# Patient Record
Sex: Female | Born: 1990 | Race: Asian | Hispanic: No | Marital: Married | Smoking: Never smoker
Health system: Southern US, Community
[De-identification: ages and names within clinical notes are randomized; demographics above are authoritative.]

## PROBLEM LIST (undated history)

## (undated) DIAGNOSIS — Z603 Acculturation difficulty: Secondary | ICD-10-CM

## (undated) DIAGNOSIS — R4589 Other symptoms and signs involving emotional state: Secondary | ICD-10-CM

## (undated) DIAGNOSIS — R509 Fever, unspecified: Secondary | ICD-10-CM

## (undated) DIAGNOSIS — R109 Unspecified abdominal pain: Secondary | ICD-10-CM

## (undated) DIAGNOSIS — R059 Cough, unspecified: Secondary | ICD-10-CM

## (undated) DIAGNOSIS — I4510 Unspecified right bundle-branch block: Secondary | ICD-10-CM

## (undated) DIAGNOSIS — R0602 Shortness of breath: Secondary | ICD-10-CM

## (undated) DIAGNOSIS — Z2839 Supervision of other high risk pregnancies, unspecified trimester: Secondary | ICD-10-CM

## (undated) DIAGNOSIS — R0789 Other chest pain: Secondary | ICD-10-CM

## (undated) DIAGNOSIS — R768 Other specified abnormal immunological findings in serum: Secondary | ICD-10-CM

## (undated) DIAGNOSIS — Z283 Underimmunization status: Secondary | ICD-10-CM

## (undated) DIAGNOSIS — R5381 Other malaise: Secondary | ICD-10-CM

## (undated) DIAGNOSIS — R05 Cough: Secondary | ICD-10-CM

## (undated) DIAGNOSIS — R52 Pain, unspecified: Secondary | ICD-10-CM

## (undated) DIAGNOSIS — K5900 Constipation, unspecified: Secondary | ICD-10-CM

## (undated) DIAGNOSIS — R5383 Other fatigue: Secondary | ICD-10-CM

## (undated) DIAGNOSIS — J321 Chronic frontal sinusitis: Secondary | ICD-10-CM

## (undated) DIAGNOSIS — K219 Gastro-esophageal reflux disease without esophagitis: Secondary | ICD-10-CM

## (undated) DIAGNOSIS — Z8759 Personal history of other complications of pregnancy, childbirth and the puerperium: Secondary | ICD-10-CM

## (undated) DIAGNOSIS — O09899 Supervision of other high risk pregnancies, unspecified trimester: Secondary | ICD-10-CM

## (undated) HISTORY — DX: Supervision of other high risk pregnancies, unspecified trimester: Z28.39

## (undated) HISTORY — DX: Cough, unspecified: R05.9

## (undated) HISTORY — DX: Chronic frontal sinusitis: J32.1

## (undated) HISTORY — DX: Acculturation difficulty: Z60.3

## (undated) HISTORY — DX: Other chest pain: R07.89

## (undated) HISTORY — DX: Pain, unspecified: R52

## (undated) HISTORY — DX: Constipation, unspecified: K59.00

## (undated) HISTORY — DX: Underimmunization status: Z28.3

## (undated) HISTORY — DX: Other fatigue: R53.83

## (undated) HISTORY — DX: Other symptoms and signs involving emotional state: R45.89

## (undated) HISTORY — DX: Fever, unspecified: R50.9

## (undated) HISTORY — DX: Personal history of other complications of pregnancy, childbirth and the puerperium: Z87.59

## (undated) HISTORY — DX: Other malaise: R53.81

## (undated) HISTORY — DX: Supervision of other high risk pregnancies, unspecified trimester: O09.899

## (undated) HISTORY — DX: Other specified abnormal immunological findings in serum: R76.8

## (undated) HISTORY — DX: Cough: R05

## (undated) HISTORY — DX: Unspecified right bundle-branch block: I45.10

## (undated) HISTORY — DX: Gastro-esophageal reflux disease without esophagitis: K21.9

## (undated) HISTORY — DX: Shortness of breath: R06.02

## (undated) HISTORY — DX: Unspecified abdominal pain: R10.9

---

## 2015-07-11 DIAGNOSIS — Z8759 Personal history of other complications of pregnancy, childbirth and the puerperium: Secondary | ICD-10-CM | POA: Insufficient documentation

## 2015-12-04 DIAGNOSIS — R768 Other specified abnormal immunological findings in serum: Secondary | ICD-10-CM | POA: Insufficient documentation

## 2015-12-30 ENCOUNTER — Encounter (HOSPITAL_COMMUNITY): Payer: Self-pay | Admitting: *Deleted

## 2015-12-30 ENCOUNTER — Emergency Department (HOSPITAL_COMMUNITY)
Admission: EM | Admit: 2015-12-30 | Discharge: 2015-12-30 | Disposition: A | Discharge status: Home / Self Care | Payer: Medicaid Other | Attending: Emergency Medicine | Admitting: Emergency Medicine

## 2015-12-30 DIAGNOSIS — Z3202 Encounter for pregnancy test, result negative: Secondary | ICD-10-CM | POA: Diagnosis not present

## 2015-12-30 DIAGNOSIS — R1013 Epigastric pain: Secondary | ICD-10-CM | POA: Diagnosis not present

## 2015-12-30 DIAGNOSIS — R109 Unspecified abdominal pain: Secondary | ICD-10-CM | POA: Diagnosis present

## 2015-12-30 LAB — URINALYSIS, ROUTINE W REFLEX MICROSCOPIC
Bilirubin Urine: NEGATIVE
GLUCOSE, UA: NEGATIVE mg/dL
Ketones, ur: NEGATIVE mg/dL
LEUKOCYTES UA: NEGATIVE
Nitrite: NEGATIVE
Protein, ur: NEGATIVE mg/dL
SPECIFIC GRAVITY, URINE: 1.011 (ref 1.005–1.030)
pH: 6.5 (ref 5.0–8.0)

## 2015-12-30 LAB — COMPREHENSIVE METABOLIC PANEL
ALK PHOS: 68 U/L (ref 38–126)
ALT: 15 U/L (ref 14–54)
AST: 21 U/L (ref 15–41)
Albumin: 4.8 g/dL (ref 3.5–5.0)
Anion gap: 11 (ref 5–15)
BILIRUBIN TOTAL: 0.4 mg/dL (ref 0.3–1.2)
BUN: 10 mg/dL (ref 6–20)
CALCIUM: 9.6 mg/dL (ref 8.9–10.3)
CO2: 25 mmol/L (ref 22–32)
CREATININE: 0.63 mg/dL (ref 0.44–1.00)
Chloride: 104 mmol/L (ref 101–111)
Glucose, Bld: 81 mg/dL (ref 65–99)
Potassium: 4.1 mmol/L (ref 3.5–5.1)
Sodium: 140 mmol/L (ref 135–145)
TOTAL PROTEIN: 8.4 g/dL — AB (ref 6.5–8.1)

## 2015-12-30 LAB — CBC WITH DIFFERENTIAL/PLATELET
Basophils Absolute: 0.1 10*3/uL (ref 0.0–0.1)
Basophils Relative: 1 %
Eosinophils Absolute: 0.4 10*3/uL (ref 0.0–0.7)
Eosinophils Relative: 7 %
HCT: 39.3 % (ref 36.0–46.0)
HEMOGLOBIN: 13.1 g/dL (ref 12.0–15.0)
LYMPHS ABS: 2.1 10*3/uL (ref 0.7–4.0)
LYMPHS PCT: 41 %
MCH: 29.8 pg (ref 26.0–34.0)
MCHC: 33.3 g/dL (ref 30.0–36.0)
MCV: 89.3 fL (ref 78.0–100.0)
MONOS PCT: 8 %
Monocytes Absolute: 0.4 10*3/uL (ref 0.1–1.0)
NEUTROS PCT: 43 %
Neutro Abs: 2.2 10*3/uL (ref 1.7–7.7)
Platelets: 271 10*3/uL (ref 150–400)
RBC: 4.4 MIL/uL (ref 3.87–5.11)
RDW: 12 % (ref 11.5–15.5)
WBC: 5.1 10*3/uL (ref 4.0–10.5)

## 2015-12-30 LAB — URINE MICROSCOPIC-ADD ON
Squamous Epithelial / LPF: NONE SEEN
WBC, UA: NONE SEEN WBC/hpf (ref 0–5)

## 2015-12-30 LAB — RAPID URINE DRUG SCREEN, HOSP PERFORMED
AMPHETAMINES: NOT DETECTED
BENZODIAZEPINES: NOT DETECTED
Barbiturates: NOT DETECTED
Cocaine: NOT DETECTED
OPIATES: NOT DETECTED
TETRAHYDROCANNABINOL: NOT DETECTED

## 2015-12-30 LAB — I-STAT BETA HCG BLOOD, ED (MC, WL, AP ONLY)

## 2015-12-30 LAB — LIPASE, BLOOD: Lipase: 28 U/L (ref 11–51)

## 2015-12-30 MED ORDER — SUCRALFATE 1 GM/10ML PO SUSP: 1.0000 g | Freq: Three times a day (TID) | ORAL | Status: DC

## 2015-12-30 MED ORDER — PANTOPRAZOLE SODIUM 20 MG PO TBEC: 20.0000 mg | DELAYED_RELEASE_TABLET | Freq: Every day | ORAL | Status: DC

## 2015-12-30 NOTE — ED Provider Notes (Signed)
CSN: [id number]     Arrival date & time 12/30/15  1228 History   First MD Initiated Contact with Patient 12/30/15 1254     Chief Complaint  Patient presents with  . Abdominal Pain     (Consider location/radiation/quality/duration/timing/severity/associated sxs/prior Treatment) HPI  Translator and family member were present for history. Patient has been having problems with abdominal pain for at least several months. There was some difficulty narrowing down location and duration. Initially there seemed to be predominantly lower abdominal pain. However after using the translator and readdressing this family member, it appears that this is epigastric pain. She has had it for several months. It is burning in quality with some cramping quality as well. It is apparently not made worse or better by eating. Patient had taken a course of Prilosec without improvement. History reviewed. No pertinent past medical history. History reviewed. No pertinent past surgical history. No family history on file. Social History  Substance Use Topics  . Smoking status: Never Smoker   . Smokeless tobacco: Never Used  . Alcohol Use: No   OB History    No data available     Review of Systems  10 Systems reviewed and are negative for acute change except as noted in the HPI.   Allergies  Review of patient's allergies indicates no known allergies.  Home Medications   Prior to Admission medications   Medication Sig Start Date End Date Taking? Authorizing Provider  pantoprazole (PROTONIX) 20 MG tablet Take 1 tablet (20 mg total) by mouth daily. 12/30/15   Arby Barrette, MD  sucralfate (CARAFATE) 1 GM/10ML suspension Take 10 mLs (1 g total) by mouth 4 (four) times daily -  with meals and at bedtime. 12/30/15   Arby Barrette, MD   BP 105/74 mmHg  Pulse 85  Temp(Src) 98.6 F (37 C) (Oral)  Resp 20  SpO2 100%  LMP 12/27/2015 Physical Exam  Constitutional: She is oriented to person, place, and time. She  appears well-developed and well-nourished.  HENT:  Head: Normocephalic and atraumatic.  Eyes: EOM are normal. Pupils are equal, round, and reactive to light.  Neck: Neck supple.  Cardiovascular: Normal rate, regular rhythm, normal heart sounds and intact distal pulses.   Pulmonary/Chest: Effort normal and breath sounds normal.  Abdominal: Soft. Bowel sounds are normal. She exhibits no distension. There is no tenderness.  Genitourinary:  Normal external female genitalia. No lesions. Speculum examination small amount of vaginal bleeding that appears consistent with menstrual bleeding. No clots. Cervix is normal in appearance. With palpation patient endorsed some degree of right adnexal tenderness. No mass or fullness present. No cervical motion tenderness.  Musculoskeletal: Normal range of motion. She exhibits no edema.  Neurological: She is alert and oriented to person, place, and time. She has normal strength. Coordination normal. GCS eye subscore is 4. GCS verbal subscore is 5. GCS motor subscore is 6.  Skin: Skin is warm, dry and intact.  Psychiatric: She has a normal mood and affect.    ED Course  Procedures (including critical care time) Labs Review Labs Reviewed  URINALYSIS, ROUTINE W REFLEX MICROSCOPIC (NOT AT [facility]) - Abnormal; Notable for the following:    Hgb urine dipstick MODERATE (*)    All other components within normal limits  COMPREHENSIVE METABOLIC PANEL - Abnormal; Notable for the following:    Total Protein 8.4 (*)    All other components within normal limits  URINE MICROSCOPIC-ADD ON - Abnormal; Notable for the following:    Bacteria, UA  RARE (*)    All other components within normal limits  URINE RAPID DRUG SCREEN, HOSP PERFORMED  CBC WITH DIFFERENTIAL/PLATELET  LIPASE, BLOOD  RPR  HIV ANTIBODY (ROUTINE TESTING)  I-STAT BETA HCG BLOOD, ED ([facility], [facility], [facility] ONLY)  GC/CHLAMYDIA PROBE AMP ([place]) NOT AT [facility]    Imaging Review No results found. I have personally  reviewed and evaluated these images and lab results as part of my medical decision-making.   EKG Interpretation None      MDM   Final diagnoses:  Epigastric pain   Patient is well in appearance. Physical examination is essentially normal. Abdominal examination did not elicit pain response. After clarifications, it appears that the patient's predominant concern is epigastric pain that is burning in nature. She does have empty prescription bottles that were for clarithromycin and amoxicillin as well as Prilosec. I do believe that gastritis is the most likely etiology. A she'll be given protonix and Carafate which she is instructed to take routinely for 2 weeks duration. She is also given instructions regarding dietary intake. Counseling was given regarding obtaining a family physician to coordinate care and possible referral to gastroenterology.    Arby Barrette, MD 12/30/15 [phone]

## 2015-12-30 NOTE — ED Notes (Addendum)
Patient c/o epigastric and low abdominal pain since August 2016.  Patient also c/o nausea.  Patient denies vomiting and diarrhea.  Patient also c/o urinary frequency and white vaginal discharge.  Patient states she has had vaginal discharge 2-3 months.  Patient also endorses fever and anxiety.  Patient's friend translated in triage, use of medical interpreter recommended.  Patient is [place].

## 2015-12-30 NOTE — Discharge Instructions (Signed)

## 2015-12-30 NOTE — ED Notes (Signed)
Person in room to transport pt home speaks english, instructions reviewed with him also.

## 2015-12-31 LAB — GC/CHLAMYDIA PROBE AMP (~~LOC~~) NOT AT ARMC
Chlamydia: NEGATIVE
Neisseria Gonorrhea: NEGATIVE

## 2016-01-01 LAB — RPR: RPR: NONREACTIVE

## 2016-01-01 LAB — HIV ANTIBODY (ROUTINE TESTING W REFLEX): HIV SCREEN 4TH GENERATION: NONREACTIVE

## 2016-10-13 DIAGNOSIS — Z2839 Other underimmunization status: Secondary | ICD-10-CM | POA: Insufficient documentation

## 2016-11-06 DIAGNOSIS — Z3492 Encounter for supervision of normal pregnancy, unspecified, second trimester: Secondary | ICD-10-CM | POA: Insufficient documentation

## 2016-12-04 DIAGNOSIS — Z789 Other specified health status: Secondary | ICD-10-CM | POA: Insufficient documentation

## 2016-12-07 ENCOUNTER — Emergency Department (HOSPITAL_COMMUNITY)
Admission: EM | Admit: 2016-12-07 | Discharge: 2016-12-07 | Disposition: A | Discharge status: Home / Self Care | Payer: Medicaid Other | Attending: Emergency Medicine | Admitting: Emergency Medicine

## 2016-12-07 ENCOUNTER — Emergency Department (HOSPITAL_COMMUNITY): Payer: Medicaid Other

## 2016-12-07 ENCOUNTER — Encounter (HOSPITAL_COMMUNITY): Payer: Self-pay

## 2016-12-07 DIAGNOSIS — R55 Syncope and collapse: Secondary | ICD-10-CM | POA: Insufficient documentation

## 2016-12-07 DIAGNOSIS — Z3A18 18 weeks gestation of pregnancy: Secondary | ICD-10-CM | POA: Diagnosis not present

## 2016-12-07 DIAGNOSIS — O26892 Other specified pregnancy related conditions, second trimester: Secondary | ICD-10-CM | POA: Diagnosis present

## 2016-12-07 DIAGNOSIS — W19XXXA Unspecified fall, initial encounter: Secondary | ICD-10-CM

## 2016-12-07 LAB — BASIC METABOLIC PANEL
Anion gap: 8 (ref 5–15)
BUN: 10 mg/dL (ref 6–20)
CHLORIDE: 106 mmol/L (ref 101–111)
CO2: 22 mmol/L (ref 22–32)
CREATININE: 0.38 mg/dL — AB (ref 0.44–1.00)
Calcium: 8.8 mg/dL — ABNORMAL LOW (ref 8.9–10.3)
GFR calc Af Amer: >60 mL/min (ref >60)
GFR calc non Af Amer: >60 mL/min (ref >60)
Glucose, Bld: 84 mg/dL (ref 65–99)
Potassium: 3.5 mmol/L (ref 3.5–5.1)
Sodium: 136 mmol/L (ref 135–145)

## 2016-12-07 LAB — URINALYSIS, ROUTINE W REFLEX MICROSCOPIC
Bilirubin Urine: NEGATIVE
GLUCOSE, UA: NEGATIVE mg/dL
HGB URINE DIPSTICK: NEGATIVE
Ketones, ur: NEGATIVE mg/dL
Leukocytes, UA: NEGATIVE
Nitrite: NEGATIVE
PROTEIN: NEGATIVE mg/dL
Specific Gravity, Urine: 1.005 (ref 1.005–1.030)
pH: 6 (ref 5.0–8.0)

## 2016-12-07 LAB — CBC
HCT: 31.7 % — ABNORMAL LOW (ref 36.0–46.0)
Hemoglobin: 10.9 g/dL — ABNORMAL LOW (ref 12.0–15.0)
MCH: 30.4 pg (ref 26.0–34.0)
MCHC: 34.4 g/dL (ref 30.0–36.0)
MCV: 88.5 fL (ref 78.0–100.0)
PLATELETS: 244 10*3/uL (ref 150–400)
RBC: 3.58 MIL/uL — AB (ref 3.87–5.11)
RDW: 13.3 % (ref 11.5–15.5)
WBC: 11.7 10*3/uL — ABNORMAL HIGH (ref 4.0–10.5)

## 2016-12-07 LAB — CBG MONITORING, ED: Glucose-Capillary: 87 mg/dL (ref 65–99)

## 2016-12-07 MED ORDER — SODIUM CHLORIDE 0.9 % IV BOLUS (SEPSIS)
1000.0000 mL | Freq: Once | INTRAVENOUS | Status: AC
  Administered 2016-12-07: 1000 mL via INTRAVENOUS

## 2016-12-07 NOTE — ED Triage Notes (Signed)
Notified [facility] of patient fall and symptoms.  Pt 16 weeks.  Per OB, no follow up needed with their assistance.

## 2016-12-07 NOTE — ED Triage Notes (Signed)
Pt is 4 months pregnant. Stood up today and fell.  Striking left side.  Pt denies bleeding. Some pressure post fall.

## 2016-12-07 NOTE — ED Notes (Signed)
cabmodian interpreter uses on wall e for discharge instructions.

## 2016-12-07 NOTE — Discharge Instructions (Signed)
Please follow up with OBGYN and talk to them about anemia and whether or not to start iron Ultrasound showed baby is doing fine Return for worsening symptoms

## 2016-12-07 NOTE — ED Provider Notes (Signed)
[facility]-EMERGENCY DEPT Provider Note   CSN: [id number] Arrival date & time: 12/07/16  1349     History   Chief Complaint Chief Complaint  Patient presents with  . Loss of Consciousness  . pregnant    HPI Brianna Haley is a 26 y.o. female who presents with syncope. She is [redacted] weeks pregnant. History is limited by language barrier. She states that she was at church and when she stood up she felt lightheaded and next thing she knew she was on the ground. She states bystanders told her she did not hit her head but did fall on to her left side. She currently reports mild left hip pain and suprapubic pain. She denies recurrent syncope, headache, vision changes, chest pain, SOB, flank pain, vaginal bleeding, or any other pain complaints. She has been getting regular prenatal care. Pregnancy has been uncomplicated thus far. She also states she has felt lightheaded at other times during her pregnancy however has never passed out before. She states she has been eating and drinking normally.  HPI  History reviewed. No pertinent past medical history.  There are no active problems to display for this patient.   History reviewed. No pertinent surgical history.  OB History    Gravida Para Term Preterm AB Living   1             SAB TAB Ectopic Multiple Live Births                   Home Medications    Prior to Admission medications   Medication Sig Start Date End Date Taking? Authorizing Provider  pantoprazole (PROTONIX) 20 MG tablet Take 1 tablet (20 mg total) by mouth daily. 12/30/15   Arby Barrette, MD  sucralfate (CARAFATE) 1 GM/10ML suspension Take 10 mLs (1 g total) by mouth 4 (four) times daily -  with meals and at bedtime. 12/30/15   Arby Barrette, MD    Family History History reviewed. No pertinent family history.  Social History Social History  Substance Use Topics  . Smoking status: Never Smoker  . Smokeless tobacco: Never Used  . Alcohol use No     Allergies   Patient  has no known allergies.   Review of Systems Review of Systems  Constitutional: Negative for appetite change.  Respiratory: Negative for shortness of breath.   Cardiovascular: Negative for chest pain.  Gastrointestinal: Negative for abdominal pain, nausea and vomiting.  Genitourinary: Positive for pelvic pain. Negative for vaginal bleeding.  Musculoskeletal: Positive for arthralgias.  Skin: Negative for wound.  Neurological: Positive for syncope and light-headedness. Negative for headaches.  All other systems reviewed and are negative.    Physical Exam Updated Vital Signs BP 112/67 (BP Location: Left Arm)   Pulse 104   Resp 17   LMP 12/27/2015   SpO2 100%   Physical Exam  Constitutional: She is oriented to person, place, and time. She appears well-developed and well-nourished. No distress.  NAD  HENT:  Head: Normocephalic and atraumatic.  Eyes: Conjunctivae are normal. Pupils are equal, round, and reactive to light. Right eye exhibits no discharge. Left eye exhibits no discharge. No scleral icterus.  Neck: Normal range of motion.  Cardiovascular: Regular rhythm.  Tachycardia present.  Exam reveals no gallop and no friction rub.   No murmur heard. Pulmonary/Chest: Effort normal and breath sounds normal. No respiratory distress. She has no wheezes. She has no rales. She exhibits no tenderness.  Abdominal: Soft. Bowel sounds are normal. She exhibits no  distension and no mass. There is tenderness. There is no rebound and no guarding. No hernia.  Gravid uterus below the umbilicus. Mild tenderness.  Neurological: She is alert and oriented to person, place, and time.  Skin: Skin is warm and dry.  Psychiatric: She has a normal mood and affect. Her behavior is normal.  Nursing note and vitals reviewed.    ED Treatments / Results  Labs (all labs ordered are listed, but only abnormal results are displayed) Labs Reviewed  BASIC METABOLIC PANEL - Abnormal; Notable for the following:        Result Value   Creatinine, Ser 0.38 (*)    Calcium 8.8 (*)    All other components within normal limits  CBC - Abnormal; Notable for the following:    WBC 11.7 (*)    RBC 3.58 (*)    Hemoglobin 10.9 (*)    HCT 31.7 (*)    All other components within normal limits  URINALYSIS, ROUTINE W REFLEX MICROSCOPIC  CBG MONITORING, ED    EKG  EKG Interpretation  Date/Time:  Sunday December 07 2016 14:14:41 EST Ventricular Rate:  89 PR Interval:    QRS Duration: 79 QT Interval:  342 QTC Calculation: 417 R Axis:   93 Text Interpretation:  Sinus rhythm Borderline short PR interval Borderline right axis deviation Nonspecific T abnormalities, lateral leads Baseline wander in lead(s) I III aVL V2 No old tracing to compare Confirmed by BELFI  MD, MELANIE ([id number]) on 12/07/2016 3:58:46 PM       Radiology No results found.  Procedures Procedures (including critical care time)  Medications Ordered in ED Medications  sodium chloride 0.9 % bolus 1,000 mL (0 mLs Intravenous Stopped 12/07/16 1858)     Initial Impression / Assessment and Plan / ED Course  I have reviewed the triage vital signs and the nursing notes.  Pertinent labs & imaging results that were available during my care of the patient were reviewed by me and considered in my medical decision making (see chart for details).  Clinical Course    26 year old female presents with syncope after standing. Possibly orthostatic since she is mildly tachycardic and BP is soft. Orthostatics remarkable for increase in pulse from 79 to 99 from lying to standing. 1L IVF given.   CBC remarkable for mild leukocytosis (11.7) and anemia (Hgb is 10.9) which is new - likely due to pregnancy. BMP unremarkable. UA clean. US shows IUP with +FHT and movement. EKG is SR with short PR.   Discussed results with patient. Advised follow up with OBGYN. Pt has been monitored on cardiac monitor for several hours without events. She overall feels at baseline.  Patient is informed of clinical course, understands medical decision making process, and agrees with plan. Opportunity for questions provided and all questions answered. Return precautions given.   Final Clinical Impressions(s) / ED Diagnoses   Final diagnoses:  Syncope and collapse    New Prescriptions New Prescriptions   No medications on file     Bethel BornKelly Marie Lewi Drost, PA-C 12/10/16 2006    Rolan BuccoMelanie Belfi, MD 12/12/16 1345

## 2016-12-07 NOTE — ED Notes (Signed)
US at bedside

## 2017-03-02 DIAGNOSIS — O2603 Excessive weight gain in pregnancy, third trimester: Secondary | ICD-10-CM | POA: Insufficient documentation

## 2017-07-22 DIAGNOSIS — R5381 Other malaise: Secondary | ICD-10-CM | POA: Insufficient documentation

## 2018-08-16 ENCOUNTER — Other Ambulatory Visit: Payer: Self-pay

## 2018-08-16 ENCOUNTER — Emergency Department (HOSPITAL_COMMUNITY): Payer: Self-pay

## 2018-08-16 ENCOUNTER — Encounter (HOSPITAL_COMMUNITY): Payer: Self-pay

## 2018-08-16 ENCOUNTER — Emergency Department (HOSPITAL_COMMUNITY)
Admission: EM | Admit: 2018-08-16 | Discharge: 2018-08-17 | Disposition: A | Discharge status: Home / Self Care | Payer: Self-pay | Attending: Emergency Medicine | Admitting: Emergency Medicine

## 2018-08-16 DIAGNOSIS — R059 Cough, unspecified: Secondary | ICD-10-CM

## 2018-08-16 DIAGNOSIS — Z79899 Other long term (current) drug therapy: Secondary | ICD-10-CM | POA: Insufficient documentation

## 2018-08-16 DIAGNOSIS — R05 Cough: Secondary | ICD-10-CM

## 2018-08-16 DIAGNOSIS — J189 Pneumonia, unspecified organism: Secondary | ICD-10-CM | POA: Insufficient documentation

## 2018-08-16 DIAGNOSIS — R112 Nausea with vomiting, unspecified: Secondary | ICD-10-CM | POA: Insufficient documentation

## 2018-08-16 LAB — COMPREHENSIVE METABOLIC PANEL
ALT: 47 U/L — ABNORMAL HIGH (ref 0–44)
ANION GAP: 9 (ref 5–15)
AST: 24 U/L (ref 15–41)
Albumin: 4.3 g/dL (ref 3.5–5.0)
Alkaline Phosphatase: 97 U/L (ref 38–126)
BUN: 10 mg/dL (ref 6–20)
CHLORIDE: 106 mmol/L (ref 98–111)
CO2: 25 mmol/L (ref 22–32)
Calcium: 9.4 mg/dL (ref 8.9–10.3)
Creatinine, Ser: 0.67 mg/dL (ref 0.44–1.00)
GFR calc non Af Amer: >60 mL/min (ref >60)
Glucose, Bld: 93 mg/dL (ref 70–99)
POTASSIUM: 4.2 mmol/L (ref 3.5–5.1)
SODIUM: 140 mmol/L (ref 135–145)
Total Bilirubin: 0.5 mg/dL (ref 0.3–1.2)
Total Protein: 7.7 g/dL (ref 6.5–8.1)

## 2018-08-16 LAB — I-STAT BETA HCG BLOOD, ED (MC, WL, AP ONLY): I-stat hCG, quantitative: <5 m[IU]/mL (ref <5)

## 2018-08-16 LAB — CBC
HEMATOCRIT: 38.8 % (ref 36.0–46.0)
Hemoglobin: 12.5 g/dL (ref 12.0–15.0)
MCH: 30.3 pg (ref 26.0–34.0)
MCHC: 32.2 g/dL (ref 30.0–36.0)
MCV: 94.2 fL (ref 78.0–100.0)
Platelets: 347 10*3/uL (ref 150–400)
RBC: 4.12 MIL/uL (ref 3.87–5.11)
RDW: 11.6 % (ref 11.5–15.5)
WBC: 7.2 10*3/uL (ref 4.0–10.5)

## 2018-08-16 LAB — LIPASE, BLOOD: LIPASE: 32 U/L (ref 11–51)

## 2018-08-16 NOTE — ED Triage Notes (Signed)
Pt endorses cough x 1 month, n/v, no period in over a month. Pt speaks fair english, primary language is cambodian. VS 110/67, Pulse 85, RR 16, 100% on RA. CBG 98.

## 2018-08-17 MED ORDER — AZITHROMYCIN 250 MG PO TABS: 250.0000 mg | ORAL_TABLET | Freq: Every day | ORAL | 0 refills | Status: DC

## 2018-08-17 MED ORDER — ONDANSETRON 4 MG PO TBDP: 4.0000 mg | ORAL_TABLET | Freq: Three times a day (TID) | ORAL | 0 refills | Status: DC | PRN

## 2018-08-17 MED ORDER — AZITHROMYCIN 250 MG PO TABS
500.0000 mg | ORAL_TABLET | Freq: Once | ORAL | Status: AC
  Administered 2018-08-17: 500 mg via ORAL
  Filled 2018-08-17: qty 2

## 2018-08-17 NOTE — ED Provider Notes (Signed)
[facility] Provider [facility]   CSN: [id number] Arrival date & time: 08/16/18  1848     History   Chief Complaint Chief Complaint  Patient presents with  . Nausea  . Emesis    HPI Verlin FesterSokan Greener is a 27 y.o. female.  The history is provided by the patient and medical records.     27 year old female here with cough for the past month as well as some nausea and one episode of nonbloody, nonbilious emesis.  History obtained via language interpreter as patient speaks [place].  States cough has been intermittently productive with thick mucus, sometimes dry.  She denies any chest pain or shortness of breath.  She has not had any sick contacts.  Does report some subjective fever and chills.  She denies any nasal congestion, ear pain, sore throat, or other symptoms.  She did try taking some TheraFlu but only took one dose as it made her feel "drowsy".  History reviewed. No pertinent past medical history.  There are no active problems to display for this patient.   History reviewed. No pertinent surgical history.   OB History    Gravida  1   Para      Term      Preterm      AB      Living        SAB      TAB      Ectopic      Multiple      Live Births               Home Medications    Prior to Admission medications   Medication Sig Start Date End Date Taking? Authorizing Provider  pantoprazole (PROTONIX) 20 MG tablet Take 1 tablet (20 mg total) by mouth daily. 12/30/15   Arby BarrettePfeiffer, Marcy, MD  sucralfate (CARAFATE) 1 GM/10ML suspension Take 10 mLs (1 g total) by mouth 4 (four) times daily -  with meals and at bedtime. 12/30/15   Arby BarrettePfeiffer, Marcy, MD    Family History History reviewed. No pertinent family history.  Social History Social History   Tobacco Use  . Smoking status: Never Smoker  . Smokeless tobacco: Never Used  Substance Use Topics  . Alcohol use: No  . Drug use: No     Allergies   Patient has no  known allergies.   Review of Systems Review of Systems  Constitutional: Positive for chills and fever.  Respiratory: Positive for cough.   All other systems reviewed and are negative.    Physical Exam Updated Vital Signs BP 112/75 (BP Location: Right Arm)   Pulse 80   Temp 98.4 F (36.9 C) (Oral)   Resp 16   LMP 07/12/2018 (Approximate)   SpO2 99%   Breastfeeding? No   Physical Exam  Constitutional: She is oriented to person, place, and time. She appears well-developed and well-nourished.  HENT:  Head: Normocephalic and atraumatic.  Mouth/Throat: Oropharynx is clear and moist.  Eyes: Pupils are equal, round, and reactive to light. Conjunctivae and EOM are normal.  Neck: Normal range of motion.  Cardiovascular: Normal rate, regular rhythm and normal heart sounds.  Pulmonary/Chest: Effort normal and breath sounds normal. No stridor. No respiratory distress.  Abdominal: Soft. Bowel sounds are normal. There is no tenderness. There is no rebound.  Musculoskeletal: Normal range of motion.  Neurological: She is alert and oriented to person, place, and time.  Skin: Skin is warm and dry.  Psychiatric: She  has a normal mood and affect.  Nursing note and vitals reviewed.    ED Treatments / Results  Labs (all labs ordered are listed, but only abnormal results are displayed) Labs Reviewed  COMPREHENSIVE METABOLIC PANEL - Abnormal; Notable for the following components:      Result Value   ALT 47 (*)    All other components within normal limits  LIPASE, BLOOD  CBC  I-STAT BETA HCG BLOOD, ED ([facility], [facility], [facility] ONLY)    EKG None  Radiology Dg Chest 2 View  Result Date: 08/16/2018 CLINICAL DATA:  Mid chest pain and upper back pain with cough x1 month EXAM: CHEST - 2 VIEW COMPARISON:  None. FINDINGS: The heart size and mediastinal contours are within normal limits. Subtle opacities in the lingula suspicious for pneumonia and/or atelectasis. The visualized skeletal structures are  unremarkable. IMPRESSION: Subtle lingular opacities suspicious for pneumonia and/or atelectasis. Electronically Signed   By: Tollie Eth M.D.   On: 08/16/2018 19:52    Procedures Procedures (including critical care time)  Medications Ordered in ED Medications  azithromycin (ZITHROMAX) tablet 500 mg (500 mg Oral Given 08/17/18 0015)     Initial Impression / Assessment and Plan / ED Course  I have reviewed the triage vital signs and the nursing notes.  Pertinent labs & imaging results that were available during my care of the patient were reviewed by me and considered in my medical decision making (see chart for details).  27 year old female here with cough for the past month as well as nausea and one episode of nonbloody, nonbilious emesis.  She is afebrile and nontoxic in appearance.  Exam is overall benign.  Lungs are clear without any significant wheezes or rhonchi, no acute respiratory distress.  Labs reviewed and are reassuring.  Chest x-ray concerning for subtle lingular opacity suspicious for pneumonia versus atelectasis.  Given her ongoing symptoms, will treat as pneumonia.  Will start on azithromycin, Zofran given for nausea.  Does not currently have PCP, given referral to patient care center for follow-up.  She will return here for any new or worsening symptoms.  Results and care plan were discussed with patient via language interpreter, she verbally acknowledged understanding.  Final Clinical Impressions(s) / ED Diagnoses   Final diagnoses:  Community acquired pneumonia, unspecified laterality  Cough    ED Discharge Orders         Ordered    azithromycin (ZITHROMAX) 250 MG tablet  Daily     08/17/18 0010    ondansetron (ZOFRAN ODT) 4 MG disintegrating tablet  Every 8 hours PRN     08/17/18 0010           Garlon Hatchet, PA-C 08/17/18 0031    Zadie Rhine, MD 08/17/18 2311

## 2018-08-17 NOTE — ED Notes (Signed)
Pt reports productive cough with yellow sputum x 1 month.  Reports warm sensation in her chest.  No pain, SOB, dizziness, but reports nausea and vomiting.  She is A&OX 4.  In NAD.  Patient history obtained via interpreter.

## 2018-08-17 NOTE — Discharge Instructions (Signed)
Take the prescribed medication as directed. Follow-up with the patient care center-- call for appt. Return to the ED for new or worsening symptoms.

## 2018-08-25 ENCOUNTER — Other Ambulatory Visit (HOSPITAL_COMMUNITY)
Admission: RE | Admit: 2018-08-25 | Discharge: 2018-08-25 | Disposition: A | Discharge status: Home / Self Care | Payer: Medicaid Other | Source: Ambulatory Visit | Attending: Obstetrics and Gynecology | Admitting: Obstetrics and Gynecology

## 2018-08-25 ENCOUNTER — Other Ambulatory Visit: Payer: Self-pay

## 2018-08-25 ENCOUNTER — Ambulatory Visit (INDEPENDENT_AMBULATORY_CARE_PROVIDER_SITE_OTHER): Payer: Medicaid Other | Admitting: Obstetrics and Gynecology

## 2018-08-25 ENCOUNTER — Encounter: Payer: Self-pay | Admitting: Obstetrics and Gynecology

## 2018-08-25 VITALS — BP 104/67 | HR 90 | Temp 98.4°F | Ht 62.0 in | Wt 112.0 lb

## 2018-08-25 DIAGNOSIS — Z01419 Encounter for gynecological examination (general) (routine) without abnormal findings: Secondary | ICD-10-CM | POA: Diagnosis not present

## 2018-08-25 DIAGNOSIS — Z0000 Encounter for general adult medical examination without abnormal findings: Secondary | ICD-10-CM

## 2018-08-25 DIAGNOSIS — Z975 Presence of (intrauterine) contraceptive device: Secondary | ICD-10-CM | POA: Insufficient documentation

## 2018-08-25 NOTE — Progress Notes (Signed)
Subjective:     Brianna Haley is a 27 y.o. female P1001 with LMP 9/3 and BMI 20 who is here for a comprehensive physical exam. The patient reports no problems.She reports a monthly 5 day period. She is sexually active using IUD (inserted in 05/2017). She reports the presence of am intermittently pruritic discharge over the past 2 months. She denies any pelvic.  History reviewed. No pertinent past medical history. History reviewed. No pertinent surgical history. No family history on file.  Social History   Socioeconomic History  . Marital status: Married    Spouse name: Not on file  . Number of children: Not on file  . Years of education: Not on file  . Highest education level: Not on file  Occupational History  . Not on file  Social Needs  . Financial resource strain: Not on file  . Food insecurity:    Worry: Not on file    Inability: Not on file  . Transportation needs:    Medical: Not on file    Non-medical: Not on file  Tobacco Use  . Smoking status: Never Smoker  . Smokeless tobacco: Never Used  Substance and Sexual Activity  . Alcohol use: No  . Drug use: No  . Sexual activity: Yes    Birth control/protection: None  Lifestyle  . Physical activity:    Days per week: Not on file    Minutes per session: Not on file  . Stress: Not on file  Relationships  . Social connections:    Talks on phone: Not on file    Gets together: Not on file    Attends religious service: Not on file    Active member of club or organization: Not on file    Attends meetings of clubs or organizations: Not on file    Relationship status: Not on file  . Intimate partner violence:    Fear of current or ex partner: Not on file    Emotionally abused: Not on file    Physically abused: Not on file    Forced sexual activity: Not on file  Other Topics Concern  . Not on file  Social History Narrative  . Not on file   Health Maintenance  Topic Date Due  . TETANUS/TDAP  03/03/2011  . PAP SMEAR   03/02/2013  . INFLUENZA VACCINE  07/01/2018  . HIV Screening  Completed       Review of Systems Pertinent items are noted in HPI.   Objective:  Blood pressure 104/67, pulse 90, temperature 98.4 F (36.9 C), height 5\' 2"  (1.575 m), weight 112 lb (50.8 kg), last menstrual period 08/03/2018.     GENERAL: Well-developed, well-nourished female in no acute distress.  HEENT: Normocephalic, atraumatic. Sclerae anicteric.  NECK: Supple. Normal thyroid.  LUNGS: Clear to auscultation bilaterally.  HEART: Regular rate and rhythm. BREASTS: Symmetric in size. No palpable masses or lymphadenopathy, skin changes, or nipple drainage. ABDOMEN: Soft, nontender, nondistended. No organomegaly. PELVIC: Normal external female genitalia. Vagina is pink and rugated.  Normal discharge. Normal appearing cervix with IUD strings extending 2 cm from os. Uterus is normal in size. No adnexal mass or tenderness. EXTREMITIES: No cyanosis, clubbing, or edema, 2+ distal pulses.    Assessment:    Healthy female exam.      Plan:    Pap smear collected Wet prep collected Patient will be contacted with abnormal results See After Visit Summary for Counseling Recommendations

## 2018-08-25 NOTE — Progress Notes (Signed)
New GYN presents for AEX/PAP.   C/o discharge and itching.

## 2018-08-26 LAB — CYTOLOGY - PAP: Diagnosis: NEGATIVE

## 2018-08-27 ENCOUNTER — Ambulatory Visit: Payer: Medicaid Other | Admitting: Family Medicine

## 2018-08-27 LAB — CERVICOVAGINAL ANCILLARY ONLY
BACTERIAL VAGINITIS: NEGATIVE
CHLAMYDIA, DNA PROBE: NEGATIVE
Candida vaginitis: POSITIVE — AB
NEISSERIA GONORRHEA: NEGATIVE
Trichomonas: NEGATIVE

## 2018-08-30 ENCOUNTER — Ambulatory Visit (INDEPENDENT_AMBULATORY_CARE_PROVIDER_SITE_OTHER): Payer: Self-pay | Admitting: Family Medicine

## 2018-08-30 ENCOUNTER — Encounter: Payer: Self-pay | Admitting: Family Medicine

## 2018-08-30 ENCOUNTER — Ambulatory Visit (HOSPITAL_COMMUNITY)
Admission: RE | Admit: 2018-08-30 | Discharge: 2018-08-30 | Disposition: A | Discharge status: Home / Self Care | Payer: Medicaid Other | Source: Ambulatory Visit | Attending: Family Medicine | Admitting: Family Medicine

## 2018-08-30 VITALS — BP 106/56 | HR 68 | Temp 98.6°F | Resp 16 | Ht 62.0 in | Wt 111.0 lb

## 2018-08-30 DIAGNOSIS — J301 Allergic rhinitis due to pollen: Secondary | ICD-10-CM

## 2018-08-30 DIAGNOSIS — J159 Unspecified bacterial pneumonia: Secondary | ICD-10-CM

## 2018-08-30 DIAGNOSIS — K219 Gastro-esophageal reflux disease without esophagitis: Secondary | ICD-10-CM

## 2018-08-30 DIAGNOSIS — R5383 Other fatigue: Secondary | ICD-10-CM

## 2018-08-30 DIAGNOSIS — R05 Cough: Secondary | ICD-10-CM | POA: Insufficient documentation

## 2018-08-30 DIAGNOSIS — Z789 Other specified health status: Secondary | ICD-10-CM

## 2018-08-30 MED ORDER — FLUTICASONE PROPIONATE 50 MCG/ACT NA SUSP: 2.0000 | Freq: Every day | NASAL | 6 refills | Status: DC

## 2018-08-30 MED ORDER — FAMOTIDINE 20 MG PO TABS: 20.0000 mg | ORAL_TABLET | Freq: Two times a day (BID) | ORAL | 2 refills | Status: DC

## 2018-08-30 MED ORDER — FLUCONAZOLE 150 MG PO TABS: 150.0000 mg | ORAL_TABLET | Freq: Once | ORAL | 0 refills | Status: DC

## 2018-08-30 NOTE — Patient Instructions (Signed)
Food Choices for Gastroesophageal Reflux Disease, Adult When you have gastroesophageal reflux disease (GERD), the foods you eat and your eating habits are very important. Choosing the right foods can help ease your discomfort. What guidelines do I need to follow?  Choose fruits, vegetables, whole grains, and low-fat dairy products.  Choose low-fat meat, fish, and poultry.  Limit fats such as oils, salad dressings, butter, nuts, and avocado.  Keep a food diary. This helps you identify foods that cause symptoms.  Avoid foods that cause symptoms. These may be different for everyone.  Eat small meals often instead of 3 large meals a day.  Eat your meals slowly, in a place where you are relaxed.  Limit fried foods.  Cook foods using methods other than frying.  Avoid drinking alcohol.  Avoid drinking large amounts of liquids with your meals.  Avoid bending over or lying down until 2-3 hours after eating. What foods are not recommended? These are some foods and drinks that may make your symptoms worse: Vegetables Tomatoes. Tomato juice. Tomato and spaghetti sauce. Chili peppers. Onion and garlic. Horseradish. Fruits Oranges, grapefruit, and lemon (fruit and juice). Meats High-fat meats, fish, and poultry. This includes hot dogs, ribs, ham, sausage, salami, and bacon. Dairy Whole milk and chocolate milk. Sour cream. Cream. Butter. Ice cream. Cream cheese. Drinks Coffee and tea. Bubbly (carbonated) drinks or energy drinks. Condiments Hot sauce. Barbecue sauce. Sweets/Desserts Chocolate and cocoa. Donuts. Peppermint and spearmint. Fats and Oils High-fat foods. This includes [place] fries and potato chips. Other Vinegar. Strong spices. This includes black pepper, white pepper, red pepper, cayenne, curry powder, cloves, ginger, and chili powder. The items listed above may not be a complete list of foods and drinks to avoid. Contact your dietitian for more information. This  information is not intended to replace advice given to you by your health care provider. Make sure you discuss any questions you have with your health care provider. Document Released: 05/18/2012 Document Revised: 04/24/2016 Document Reviewed: 09/21/2013 Elsevier Interactive Patient Education  2017 Elsevier Inc. Allergic Rhinitis, Adult Allergic rhinitis is an allergic reaction that affects the mucous membrane inside the nose. It causes sneezing, a runny or stuffy nose, and the feeling of mucus going down the back of the throat (postnasal drip). Allergic rhinitis can be mild to severe. There are two types of allergic rhinitis:  Seasonal. This type is also called hay fever. It happens only during certain seasons.  Perennial. This type can happen at any time of the year.  What are the causes? This condition happens when the body's defense system (immune system) responds to certain harmless substances called allergens as though they were germs.  Seasonal allergic rhinitis is triggered by pollen, which can come from grasses, trees, and weeds. Perennial allergic rhinitis may be caused by:  House dust mites.  Pet dander.  Mold spores.  What are the signs or symptoms? Symptoms of this condition include:  Sneezing.  Runny or stuffy nose (nasal congestion).  Postnasal drip.  Itchy nose.  Tearing of the eyes.  Trouble sleeping.  Daytime sleepiness.  How is this diagnosed? This condition may be diagnosed based on:  Your medical history.  A physical exam.  Tests to check for related conditions, such as: ? Asthma. ? Pink eye. ? Ear infection. ? Upper respiratory infection.  Tests to find out which allergens trigger your symptoms. These may include skin or blood tests.  How is this treated? There is no cure for this condition, but treatment  can help control symptoms. Treatment may include:  Taking medicines that block allergy symptoms, such as antihistamines. Medicine may  be given as a shot, nasal spray, or pill.  Avoiding the allergen.  Desensitization. This treatment involves getting ongoing shots until your body becomes less sensitive to the allergen. This treatment may be done if other treatments do not help.  If taking medicine and avoiding the allergen does not work, new, stronger medicines may be prescribed.  Follow these instructions at home:  Find out what you are allergic to. Common allergens include smoke, dust, and pollen.  Avoid the things you are allergic to. These are some things you can do to help avoid allergens: ? Replace carpet with wood, tile, or vinyl flooring. Carpet can trap dander and dust. ? Do not smoke. Do not allow smoking in your home. ? Change your heating and air conditioning filter at least once a month. ? During allergy season:  Keep windows closed as much as possible.  Plan outdoor activities when pollen counts are lowest. This is usually during the evening hours.  When coming indoors, change clothing and shower before sitting on furniture or bedding.  Take over-the-counter and prescription medicines only as told by your health care provider.  Keep all follow-up visits as told by your health care provider. This is important. Contact a health care provider if:  You have a fever.  You develop a persistent cough.  You make whistling sounds when you breathe (you wheeze).  Your symptoms interfere with your normal daily activities. Get help right away if:  You have shortness of breath. Summary  This condition can be managed by taking medicines as directed and avoiding allergens.  Contact your health care provider if you develop a persistent cough or fever.  During allergy season, keep windows closed as much as possible. This information is not intended to replace advice given to you by your health care provider. Make sure you discuss any questions you have with your health care provider. Document Released:  08/12/2001 Document Revised: 12/25/2016 Document Reviewed: 12/25/2016 Elsevier Interactive Patient Education  [employer].

## 2018-08-30 NOTE — Addendum Note (Signed)
Addended by: Catalina Antigua on: 08/30/2018 08:15 AM   Modules accepted: Orders

## 2018-08-30 NOTE — Progress Notes (Signed)
[facility]  New Patient Encounter Provider: Mike Gip, [place]    ZOX:[id number]  [id number]  DOB - 01/23/1991  SUBJECTIVE:   Brianna Haley, is a 27 y.o. female who presents to establish care with this clinic.   Current problems/concerns:  Patient states that she has a cough that has been present x 2 months. Patient works as a Advertising account planner. Wears 2 masks and continues to cough. Patient states that she is concerned on the affects of the chemicals with her lungs.  Patient states that she has fatigue. She does not have difficulty with sleeping.  Patient seen in the ED on 08/16/2018 and diagnosed with pneumonia. Patient states that she completed the antibiotics.   No Known Allergies History reviewed. No pertinent past medical history. No current outpatient medications on file prior to visit.   No current facility-administered medications on file prior to visit.    History reviewed. No pertinent family history. Social History   Socioeconomic History  . Marital status: Married    Spouse name: Not on file  . Number of children: Not on file  . Years of education: Not on file  . Highest education level: Not on file  Occupational History  . Not on file  Social Needs  . Financial resource strain: Not on file  . Food insecurity:    Worry: Not on file    Inability: Not on file  . Transportation needs:    Medical: Not on file    Non-medical: Not on file  Tobacco Use  . Smoking status: Never Smoker  . Smokeless tobacco: Never Used  Substance and Sexual Activity  . Alcohol use: No  . Drug use: No  . Sexual activity: Yes    Birth control/protection: None  Lifestyle  . Physical activity:    Days per week: Not on file    Minutes per session: Not on file  . Stress: Not on file  Relationships  . Social connections:    Talks on phone: Not on file    Gets together: Not on file    Attends religious service: Not on file   Active member of club or organization: Not on file    Attends meetings of clubs or organizations: Not on file    Relationship status: Not on file  . Intimate partner violence:    Fear of current or ex partner: Not on file    Emotionally abused: Not on file    Physically abused: Not on file    Forced sexual activity: Not on file  Other Topics Concern  . Not on file  Social History Narrative  . Not on file    Review of Systems  Constitutional: Negative.   HENT: Negative.   Eyes: Negative.   Respiratory: Positive for cough.   Cardiovascular: Negative.   Gastrointestinal: Negative.   Genitourinary: Negative.   Musculoskeletal: Negative.   Skin: Negative.   Neurological: Negative.   Psychiatric/Behavioral: Negative.      OBJECTIVE:    BP (!) 106/56 (BP Location: Left Arm, Patient Position: Sitting, Cuff Size: Normal)   Pulse 68   Temp 98.6 F (37 C) (Oral)   Resp 16   Ht 5\' 2"  (1.575 m)   Wt 111 lb (50.3 kg)   LMP 07/03/2018   SpO2 100%   BMI 20.30 kg/m   Physical Exam  Constitutional: She is oriented to person, place, and time and well-developed, well-nourished, and in no distress. No distress.  HENT:  Head: Normocephalic and atraumatic.  Eyes: Pupils are equal, round, and reactive to light. Conjunctivae and EOM are normal.  Neck: Normal range of motion. Neck supple.  Cardiovascular: Normal rate, regular rhythm and intact distal pulses. Exam reveals no gallop and no friction rub.  No murmur heard. Pulmonary/Chest: Effort normal and breath sounds normal. No respiratory distress. She has no wheezes.  Abdominal: Soft. Bowel sounds are normal. There is tenderness (epigastric).  Musculoskeletal: Normal range of motion. She exhibits no edema or tenderness.  Lymphadenopathy:    She has no cervical adenopathy.  Neurological: She is alert and oriented to person, place, and time. Gait normal.  Skin: Skin is warm and dry.  Psychiatric: Mood, memory, affect and judgment  normal.  Nursing note and vitals reviewed.    ASSESSMENT/PLAN:  1. Community acquired bacterial pneumonia - DG Chest 2 View; Future  2. Fatigue, unspecified type Pending labs. Will adjust medications accordingly.   - DG Chest 2 View; Future - CBC with Differential - TSH - Comprehensive metabolic panel  3. Non-seasonal allergic rhinitis due to pollen Suspect cough continuation due to allergies and GERD. Discussed nasal lavage.  - fluticasone (FLONASE) 50 MCG/ACT nasal spray; Place 2 sprays into both nostrils daily.  Dispense: 16 g; Refill: 6 - famotidine (PEPCID) 20 MG tablet; Take 1 tablet (20 mg total) by mouth 2 (two) times daily.  Dispense: 60 tablet; Refill: 2  4. Gastroesophageal reflux disease, esophagitis presence not specified - famotidine (PEPCID) 20 MG tablet; Take 1 tablet (20 mg total) by mouth 2 (two) times daily.  Dispense: 60 tablet; Refill: 2  Language Barrier : In person interpreter used during the visit. Return if symptoms worsen or fail to improve.  The patient was given clear instructions to go to ER or return to medical center if symptoms don't improve, worsen or new problems develop. The patient verbalized understanding. The patient was told to call to get lab results if they haven't heard anything in the next week.     This note has been created with Education officer, environmental. Any transcriptional errors are unintentional.   Ms. Andr L. Riley Lam, FNP-BC [facility] [address] [place], [place] [address] [phone]

## 2018-08-31 LAB — COMPREHENSIVE METABOLIC PANEL
ALT: 17 IU/L (ref 0–32)
AST: 15 IU/L (ref 0–40)
Albumin/Globulin Ratio: 1.6 (ref 1.2–2.2)
Albumin: 4.4 g/dL (ref 3.5–5.5)
Alkaline Phosphatase: 82 IU/L (ref 39–117)
BUN/Creatinine Ratio: 19 (ref 9–23)
BUN: 11 mg/dL (ref 6–20)
Bilirubin Total: 0.5 mg/dL (ref 0.0–1.2)
CO2: 24 mmol/L (ref 20–29)
Calcium: 9.3 mg/dL (ref 8.7–10.2)
Chloride: 104 mmol/L (ref 96–106)
Creatinine, Ser: 0.58 mg/dL (ref 0.57–1.00)
GFR calc Af Amer: 147 mL/min/{1.73_m2} (ref >59)
GFR calc non Af Amer: 128 mL/min/{1.73_m2} (ref >59)
Globulin, Total: 2.7 g/dL (ref 1.5–4.5)
Glucose: 75 mg/dL (ref 65–99)
Potassium: 4.4 mmol/L (ref 3.5–5.2)
Sodium: 139 mmol/L (ref 134–144)
Total Protein: 7.1 g/dL (ref 6.0–8.5)

## 2018-08-31 LAB — CBC WITH DIFFERENTIAL/PLATELET
Basophils Absolute: 0.1 10*3/uL (ref 0.0–0.2)
Basos: 2 %
EOS (ABSOLUTE): 0.1 10*3/uL (ref 0.0–0.4)
Eos: 3 %
Hematocrit: 37.7 % (ref 34.0–46.6)
Hemoglobin: 12.2 g/dL (ref 11.1–15.9)
Immature Grans (Abs): 0 10*3/uL (ref 0.0–0.1)
Immature Granulocytes: 0 %
Lymphocytes Absolute: 3 10*3/uL (ref 0.7–3.1)
Lymphs: 54 %
MCH: 29.8 pg (ref 26.6–33.0)
MCHC: 32.4 g/dL (ref 31.5–35.7)
MCV: 92 fL (ref 79–97)
Monocytes Absolute: 0.3 10*3/uL (ref 0.1–0.9)
Monocytes: 6 %
Neutrophils Absolute: 1.9 10*3/uL (ref 1.4–7.0)
Neutrophils: 35 %
Platelets: 341 10*3/uL (ref 150–450)
RBC: 4.09 x10E6/uL (ref 3.77–5.28)
RDW: 11.5 % — ABNORMAL LOW (ref 12.3–15.4)
WBC: 5.5 10*3/uL (ref 3.4–10.8)

## 2018-08-31 LAB — TSH: TSH: 0.544 u[IU]/mL (ref 0.450–4.500)

## 2018-09-01 ENCOUNTER — Telehealth: Payer: Self-pay

## 2018-09-01 NOTE — Telephone Encounter (Signed)
Called and spoke with patient, advised of normal labs and that chest xray did not show any pneumonia. Patient verbalized understanding. Thanks!

## 2018-09-01 NOTE — Telephone Encounter (Signed)
-----   Message from Mike Gip, FNP sent at 09/01/2018  2:43 AM EDT ----- Please call patient and inform her that her labs were normal and the chest xray did not show any pneumonia.  Continue with the medications that she was given.

## 2019-01-31 ENCOUNTER — Ambulatory Visit: Payer: Medicaid Other | Admitting: Family Medicine

## 2019-02-02 ENCOUNTER — Ambulatory Visit (INDEPENDENT_AMBULATORY_CARE_PROVIDER_SITE_OTHER): Payer: Self-pay | Admitting: Family Medicine

## 2019-02-02 ENCOUNTER — Other Ambulatory Visit: Payer: Self-pay

## 2019-02-02 ENCOUNTER — Encounter: Payer: Self-pay | Admitting: Family Medicine

## 2019-02-02 VITALS — BP 127/69 | HR 94 | Temp 99.2°F | Resp 16 | Ht 62.0 in | Wt 117.4 lb

## 2019-02-02 DIAGNOSIS — R52 Pain, unspecified: Secondary | ICD-10-CM

## 2019-02-02 DIAGNOSIS — Z789 Other specified health status: Secondary | ICD-10-CM

## 2019-02-02 DIAGNOSIS — J0110 Acute frontal sinusitis, unspecified: Secondary | ICD-10-CM

## 2019-02-02 LAB — POCT INFLUENZA A/B
Influenza A, POC: NEGATIVE
Influenza B, POC: NEGATIVE

## 2019-02-02 MED ORDER — SALINE SPRAY 0.65 % NA SOLN: 1.0000 | NASAL | 0 refills | Status: DC | PRN

## 2019-02-02 MED ORDER — AMOXICILLIN-POT CLAVULANATE 875-125 MG PO TABS: 1.0000 | ORAL_TABLET | Freq: Two times a day (BID) | ORAL | 0 refills | Status: DC

## 2019-02-02 MED ORDER — FLUTICASONE PROPIONATE 50 MCG/ACT NA SUSP: 2.0000 | Freq: Every day | NASAL | 6 refills | Status: DC

## 2019-02-02 NOTE — Patient Instructions (Signed)

## 2019-02-02 NOTE — Progress Notes (Signed)
[facility]   Progress Note: General Provider: Mike Gip, FNP  SUBJECTIVE:   Brianna Haley is a 28 y.o. female who  has no past medical history on file.. Patient presents today for Cough  Sinus pain and congestion with cough x 2 weeks. Patient has taken otc medications with out significant relief.   Review of Systems  Constitutional: Positive for malaise/fatigue.  HENT: Positive for congestion and sinus pain. Negative for sore throat.   Eyes: Negative.   Respiratory: Positive for cough and sputum production. Negative for shortness of breath and wheezing.   Cardiovascular: Negative.   Gastrointestinal: Positive for nausea.  Genitourinary: Negative.   Musculoskeletal: Positive for myalgias.  Skin: Negative.   Neurological: Positive for headaches.  Psychiatric/Behavioral: Negative.      OBJECTIVE: BP 127/69 (BP Location: Right Arm, Patient Position: Sitting)   Pulse 94   Temp 99.2 F (37.3 C) (Oral)   Resp 16   Ht 5\' 2"  (1.575 m)   Wt 117 lb 6.4 oz (53.3 kg)   SpO2 100%   BMI 21.47 kg/m   Wt Readings from Last 3 Encounters:  02/02/19 117 lb 6.4 oz (53.3 kg)  08/30/18 111 lb (50.3 kg)  08/25/18 112 lb (50.8 kg)     Physical Exam Vitals signs and nursing note reviewed.  Constitutional:      General: She is not in acute distress.    Appearance: She is well-developed.  HENT:     Head: Normocephalic and atraumatic.     Right Ear: Tympanic membrane normal.     Left Ear: Tympanic membrane normal.     Nose: Congestion present.     Mouth/Throat:     Mouth: Mucous membranes are moist.     Pharynx: No oropharyngeal exudate or posterior oropharyngeal erythema.  Eyes:     Extraocular Movements: Extraocular movements intact.     Conjunctiva/sclera: Conjunctivae normal.     Pupils: Pupils are equal, round, and reactive to light.  Neck:     Musculoskeletal: Normal range of motion.  Cardiovascular:     Rate and Rhythm:  Normal rate and regular rhythm.     Heart sounds: Normal heart sounds.  Pulmonary:     Effort: Pulmonary effort is normal. No respiratory distress.     Breath sounds: Normal breath sounds.  Abdominal:     General: Bowel sounds are normal. There is no distension.     Palpations: Abdomen is soft.  Musculoskeletal: Normal range of motion.  Skin:    General: Skin is warm and dry.  Neurological:     Mental Status: She is alert and oriented to person, place, and time.  Psychiatric:        Mood and Affect: Mood normal.        Behavior: Behavior normal.        Thought Content: Thought content normal.     ASSESSMENT/PLAN:  1. Acute non-recurrent frontal sinusitis Negative influenza in the office today.  - POCT Influenza A/B - amoxicillin-clavulanate (AUGMENTIN) 875-125 MG tablet; Take 1 tablet by mouth 2 (two) times daily.  Dispense: 20 tablet; Refill: 0 - sodium chloride (OCEAN) 0.65 % SOLN nasal spray; Place 1 spray into both nostrils as needed for congestion.  Dispense: 50 mL; Refill: 0 - fluticasone (FLONASE) 50 MCG/ACT nasal spray; Place 2 sprays into both nostrils daily.  Dispense: 16 g; Refill: 6  2. Body aches Negative Flu - POCT Influenza A/B  3. Language barrier to communication  Interpreter services utilized throughout the encounter.      Return if symptoms worsen or fail to improve.    The patient was given clear instructions to go to ER or return to medical center if symptoms do not improve, worsen or new problems develop. The patient verbalized understanding and agreed with plan of care.   [name]. Brianna Lam, FNP-BC [facility] [address] [place], [place] [address] [phone]

## 2019-02-09 ENCOUNTER — Other Ambulatory Visit: Payer: Self-pay

## 2019-02-09 ENCOUNTER — Ambulatory Visit (INDEPENDENT_AMBULATORY_CARE_PROVIDER_SITE_OTHER): Payer: Self-pay | Admitting: Family Medicine

## 2019-02-09 ENCOUNTER — Ambulatory Visit (HOSPITAL_COMMUNITY)
Admission: RE | Admit: 2019-02-09 | Discharge: 2019-02-09 | Disposition: A | Discharge status: Home / Self Care | Payer: Medicaid Other | Source: Ambulatory Visit | Attending: Family Medicine | Admitting: Family Medicine

## 2019-02-09 ENCOUNTER — Encounter: Payer: Self-pay | Admitting: Family Medicine

## 2019-02-09 VITALS — BP 106/62 | HR 81 | Temp 98.3°F | Resp 18 | Wt 118.0 lb

## 2019-02-09 DIAGNOSIS — R05 Cough: Secondary | ICD-10-CM | POA: Insufficient documentation

## 2019-02-09 DIAGNOSIS — Z789 Other specified health status: Secondary | ICD-10-CM

## 2019-02-09 DIAGNOSIS — Z3202 Encounter for pregnancy test, result negative: Secondary | ICD-10-CM

## 2019-02-09 DIAGNOSIS — R059 Cough, unspecified: Secondary | ICD-10-CM

## 2019-02-09 DIAGNOSIS — K219 Gastro-esophageal reflux disease without esophagitis: Secondary | ICD-10-CM

## 2019-02-09 DIAGNOSIS — R5383 Other fatigue: Secondary | ICD-10-CM

## 2019-02-09 DIAGNOSIS — I4510 Unspecified right bundle-branch block: Secondary | ICD-10-CM

## 2019-02-09 DIAGNOSIS — J301 Allergic rhinitis due to pollen: Secondary | ICD-10-CM

## 2019-02-09 LAB — POCT URINE PREGNANCY: Preg Test, Ur: NEGATIVE

## 2019-02-09 MED ORDER — FAMOTIDINE 20 MG PO TABS: 20.0000 mg | ORAL_TABLET | Freq: Two times a day (BID) | ORAL | 2 refills | Status: DC

## 2019-02-09 NOTE — Patient Instructions (Addendum)
I am also referring you to cardiology.    Famotidine tablets or gelcaps What is this medicine? FAMOTIDINE (fa MOE ti deen) is a type of antihistamine that blocks the release of stomach acid. It is used to treat stomach or intestinal ulcers. It can also relieve heartburn from acid reflux. This medicine may be used for other purposes; ask your health care provider or pharmacist if you have questions. COMMON BRAND NAME(S): Heartburn Relief, Pepcid, Pepcid AC, Pepcid AC Maximum Strength What should I tell my health care provider before I take this medicine? They need to know if you have any of these conditions: -kidney or liver disease -trouble swallowing -an unusual or allergic reaction to famotidine, other medicines, foods, dyes, or preservatives -pregnant or trying to get pregnant -breast-feeding How should I use this medicine? Take this medicine by mouth with a glass of water. Follow the directions on the prescription label. If you only take this medicine once a day, take it at bedtime. Take your doses at regular intervals. Do not take your medicine more often than directed. Talk to your pediatrician regarding the use of this medicine in children. Special care may be needed. Overdosage: If you think you have taken too much of this medicine contact a poison control center or emergency room at once. NOTE: This medicine is only for you. Do not share this medicine with others. What if I miss a dose? If you miss a dose, take it as soon as you can. If it is almost time for your next dose, take only that dose. Do not take double or extra doses. What may interact with this medicine? -delavirdine -itraconazole -ketoconazole This list may not describe all possible interactions. Give your health care provider a list of all the medicines, herbs, non-prescription drugs, or dietary supplements you use. Also tell them if you smoke, drink alcohol, or use illegal drugs. Some items may interact with your  medicine. What should I watch for while using this medicine? Tell your doctor or health care professional if your condition does not start to get better or if it gets worse. Finish the full course of tablets prescribed, even if you feel better. Do not take with aspirin, ibuprofen or other antiinflammatory medicines. These can make your condition worse. Do not smoke cigarettes or drink alcohol. These cause irritation in your stomach and can increase the time it will take for ulcers to heal. If you get black, tarry stools or vomit up what looks like coffee grounds, call your doctor or health care professional at once. You may have a bleeding ulcer. This medicine may cause a decrease in vitamin B12. You should make sure that you get enough vitamin B12 while you are taking this medicine. Discuss the foods you eat and the vitamins you take with your health care professional. What side effects may I notice from receiving this medicine? Side effects that you should report to your doctor or health care professional as soon as possible: -agitation, nervousness -confusion -hallucinations -skin rash, itching Side effects that usually do not require medical attention (report to your doctor or health care professional if they continue or are bothersome): -constipation -diarrhea -dizziness -headache This list may not describe all possible side effects. Call your doctor for medical advice about side effects. You may report side effects to FDA at 1-800-FDA-1088. Where should I keep my medicine? Keep out of the reach of children. Store at room temperature between 15 and 30 degrees C (59 and 86 degrees F). Do  not freeze. Throw away any unused medicine after the expiration date. NOTE: This sheet is a summary. It may not cover all possible information. If you have questions about this medicine, talk to your doctor, pharmacist, or health care provider.  2019 Elsevier/Gold Standard (2017-07-03 13:17:50)

## 2019-02-09 NOTE — Progress Notes (Signed)
[facility]   Progress Note: Sick Visit Provider: Mike Gip, FNP  SUBJECTIVE:   Brianna Haley is a 28 y.o. female who  has no past medical history on file.. Patient presents today for Cough (2 weeks cough) and Shortness of Breath  Patient seen on 02/02/2019 and diagnosed with sinus infection. She states that she completed the antibiotics and is feeling better. She now has a cough and shortness of breath x 2 weeks. She states that she has some chest discomfort that waxes and wanes. This has been occurring for several years.  She states that she "feels hot from the inside" but denies fever. Patient works at the [employer] and when she coughs, feels nauseated and dizzy. 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 The cough is worse at work and is not as bad at home. Denies a history of allergic rhinitis. Patient states that the chest pain occurs even without coughing.  Review of Systems  Constitutional: Negative.   HENT: Negative.   Eyes: Negative.   Respiratory: Positive for cough.   Cardiovascular: Negative.   Gastrointestinal: Positive for nausea.  Genitourinary: Negative.   Musculoskeletal: Negative.   Skin: Negative.   Neurological: Positive for dizziness.  Psychiatric/Behavioral: Negative.      OBJECTIVE: BP 106/62 (BP Location: Left Arm, Patient Position: Sitting, Cuff Size: Normal)   Pulse 81   Temp 98.3 F (36.8 C) (Oral)   Resp 18   Wt 118 lb (53.5 kg)   SpO2 100%   BMI 21.58 kg/m   Wt Readings from Last 3 Encounters:  02/09/19 118 lb (53.5 kg)  02/02/19 117 lb 6.4 oz (53.3 kg)  08/30/18 111 lb (50.3 kg)     Physical Exam Vitals signs and nursing note reviewed.  Constitutional:      General: She is not in acute distress.    Appearance: She is well-developed.  HENT:     Head: Normocephalic and atraumatic.  Eyes:     Conjunctiva/sclera: Conjunctivae normal.   Pupils: Pupils are equal, round, and reactive to light.  Neck:     Musculoskeletal: Normal range of motion.  Cardiovascular:     Rate and Rhythm: Normal rate and regular rhythm.     Heart sounds: Normal heart sounds.  Pulmonary:     Effort: Pulmonary effort is normal. No respiratory distress.     Breath sounds: Normal breath sounds.  Abdominal:     General: Bowel sounds are normal. There is no distension.     Palpations: Abdomen is soft.  Musculoskeletal: Normal range of motion.  Skin:    General: Skin is warm and dry.  Neurological:     Mental Status: She is alert and oriented to person, place, and time.  Psychiatric:        Behavior: Behavior normal.        Thought Content: Thought content normal.     ASSESSMENT/PLAN:   1. Cough Started pepcid. Chest xray ordered.  - DG Chest 2 View; Future  2. Right bundle branch block (RBBB) determined by electrocardiography Small rbbb seen on EKG. Present on last EKG of 12/2016. Due to symptoms, will refer to cardiology for holter monitor and further evaluation.  - Ambulatory referral to Cardiology  3. Language barrier to communication Interpreter services utilized throughout the encounter.    4. Non-seasonal allergic rhinitis due to pollen R/O GERD as well.  - famotidine (PEPCID) 20  MG tablet; Take 1 tablet (20 mg total) by mouth 2 (two) times daily.  Dispense: 60 tablet; Refill: 2  5. Gastroesophageal reflux disease, esophagitis presence not specified - famotidine (PEPCID) 20 MG tablet; Take 1 tablet (20 mg total) by mouth 2 (two) times daily.  Dispense: 60 tablet; Refill: 2       The patient was given clear instructions to go to ER or return to medical center if symptoms do not improve, worsen or new problems develop. The patient verbalized understanding and agreed with plan of care.   [name]. Brianna Lam, FNP-BC [facility] [address] [place], [place] [address] [phone]      This note has been created with Dragon speech recognition software and smart phrase technology. Any transcriptional errors are unintentional.

## 2019-02-17 ENCOUNTER — Other Ambulatory Visit: Payer: Self-pay

## 2019-02-17 ENCOUNTER — Ambulatory Visit (INDEPENDENT_AMBULATORY_CARE_PROVIDER_SITE_OTHER): Payer: Self-pay | Admitting: Family Medicine

## 2019-02-17 ENCOUNTER — Encounter: Payer: Self-pay | Admitting: Family Medicine

## 2019-02-17 VITALS — BP 96/58 | HR 100 | Temp 98.3°F | Resp 16 | Wt 116.0 lb

## 2019-02-17 DIAGNOSIS — Z789 Other specified health status: Secondary | ICD-10-CM

## 2019-02-17 DIAGNOSIS — K5900 Constipation, unspecified: Secondary | ICD-10-CM

## 2019-02-17 DIAGNOSIS — R5383 Other fatigue: Secondary | ICD-10-CM

## 2019-02-17 MED ORDER — DOCUSATE SODIUM 100 MG PO CAPS: 100.0000 mg | ORAL_CAPSULE | Freq: Two times a day (BID) | ORAL | 0 refills | Status: DC

## 2019-02-17 NOTE — Patient Instructions (Signed)
Fatigue If you have fatigue, you feel tired all the time and have a lack of energy or a lack of motivation. Fatigue may make it difficult to start or complete tasks because of exhaustion. In general, occasional or mild fatigue is often a normal response to activity or life. However, long-lasting (chronic) or extreme fatigue may be a symptom of a medical condition. Follow these instructions at home: General instructions  Watch your fatigue for any changes.  Go to bed and get up at the same time every day.  Avoid fatigue by pacing yourself during the day and getting enough sleep at night.  Maintain a healthy weight. Medicines  Take over-the-counter and prescription medicines only as told by your health care provider.  Take a multivitamin, if told by your health care provider.  Do not use herbal or dietary supplements unless they are approved by your health care provider. Activity   Exercise regularly, as told by your health care provider.  Use or practice techniques to help you relax, such as yoga, tai chi, meditation, or massage therapy. Eating and drinking   Avoid heavy meals in the evening.  Eat a well-balanced diet, which includes lean proteins, whole grains, plenty of fruits and vegetables, and low-fat dairy products.  Avoid consuming too much caffeine.  Avoid the use of alcohol.  Drink enough fluid to keep your urine pale yellow. Lifestyle  Change situations that cause you stress. Try to keep your work and personal schedule in balance.  Do not use any products that contain nicotine or tobacco, such as cigarettes and e-cigarettes. If you need help quitting, ask your health care provider.  Do not use drugs. Contact a health care provider if:  Your fatigue does not get better.  You have a fever.  You suddenly lose or gain weight.  You have headaches.  You have trouble falling asleep or sleeping through the night.  You feel angry, guilty, anxious, or sad.   You are unable to have a bowel movement (constipation).  Your skin is dry.  You have swelling in your legs or another part of your body. Get help right away if:  You feel confused.  Your vision is blurry.  You feel faint or you pass out.  You have a severe headache.  You have severe pain in your abdomen, your back, or the area between your waist and hips (pelvis).  You have chest pain, shortness of breath, or an irregular or fast heartbeat.  You are unable to urinate, or you urinate less than normal.  You have abnormal bleeding, such as bleeding from the rectum, vagina, nose, lungs, or nipples.  You vomit blood.  You have thoughts about hurting yourself or others. If you ever feel like you may hurt yourself or others, or have thoughts about taking your own life, get help right away. You can go to your nearest emergency department or call:  Your local emergency services (911 in the [place]  A suicide crisis helpline, such as the [place] at (505)831-7817. This is open 24 hours a day. Summary  If you have fatigue, you feel tired all the time and have a lack of energy or a lack of motivation.  Fatigue may make it difficult to start or complete tasks because of exhaustion.  Long-lasting (chronic) or extreme fatigue may be a symptom of a medical condition.  Exercise regularly, as told by your health care provider.  Change situations that cause you stress. Try to keep your  work and personal schedule in balance. This information is not intended to replace advice given to you by your health care provider. Make sure you discuss any questions you have with your health care provider. Document Released: 09/14/2007 Document Revised: 08/12/2017 Document Reviewed: 08/12/2017 Elsevier Interactive Patient Education  2019 [employer]. Constipation, Adult Constipation is when a person:  Poops (has a bowel movement) fewer times in a week than normal.  Has a  hard time pooping.  Has poop that is dry, hard, or bigger than normal. Follow these instructions at home: Eating and drinking   Eat foods that have a lot of fiber, such as: ? Fresh fruits and vegetables. ? Whole grains. ? Beans.  Eat less of foods that are high in fat, low in fiber, or overly processed, such as: ? [place] fries. ? Hamburgers. ? Cookies. ? Candy. ? Soda.  Drink enough fluid to keep your pee (urine) clear or pale yellow. General instructions  Exercise regularly or as told by your doctor.  Go to the restroom when you feel like you need to poop. Do not hold it in.  Take over-the-counter and prescription medicines only as told by your doctor. These include any fiber supplements.  Do pelvic floor retraining exercises, such as: ? Doing deep breathing while relaxing your lower belly (abdomen). ? Relaxing your pelvic floor while pooping.  Watch your condition for any changes.  Keep all follow-up visits as told by your doctor. This is important. Contact a doctor if:  You have pain that gets worse.  You have a fever.  You have not pooped for 4 days.  You throw up (vomit).  You are not hungry.  You lose weight.  You are bleeding from the anus.  You have thin, pencil-like poop (stool). Get help right away if:  You have a fever, and your symptoms suddenly get worse.  You leak poop or have blood in your poop.  Your belly feels hard or bigger than normal (is bloated).  You have very bad belly pain.  You feel dizzy or you faint. This information is not intended to replace advice given to you by your health care provider. Make sure you discuss any questions you have with your health care provider. Document Released: 05/05/2008 Document Revised: 06/06/2016 Document Reviewed: 05/07/2016 Elsevier Interactive Patient Education  2019 [employer].

## 2019-02-17 NOTE — Progress Notes (Signed)
[facility]   Progress Note: General Provider: Mike Gip, FNP  SUBJECTIVE:   Patriece Haley is a 28 y.o. female who  has no past medical history on file.. Patient presents today for Follow-up (cough better, feels OK, tired at time )  Patient states that her cough has improved, but she continues to feel tired.  Completed medications. Denies past history of anemia. Patient states that she has difficulty with bowel movements. Has to strain but denies blood in stools or on toilet paper.   Review of Systems  Constitutional: Positive for malaise/fatigue. Negative for chills, fever and weight loss.  HENT: Negative.   Respiratory: Negative.   Cardiovascular: Negative.   Neurological: Negative.   Psychiatric/Behavioral: Negative.      OBJECTIVE: BP (!) 96/58 (BP Location: Left Arm, Patient Position: Sitting, Cuff Size: Small)   Pulse 100   Temp 98.3 F (36.8 C) (Oral)   Resp 16   Wt 116 lb (52.6 kg)   SpO2 99%   BMI 21.22 kg/m   Wt Readings from Last 3 Encounters:  02/17/19 116 lb (52.6 kg)  02/09/19 118 lb (53.5 kg)  02/02/19 117 lb 6.4 oz (53.3 kg)     Physical Exam Constitutional:      General: She is not in acute distress.    Appearance: Normal appearance. She is normal weight.  HENT:     Head: Normocephalic and atraumatic.  Neck:     Musculoskeletal: Normal range of motion.  Cardiovascular:     Rate and Rhythm: Normal rate and regular rhythm.     Pulses: Normal pulses.     Heart sounds: Normal heart sounds.  Pulmonary:     Effort: Pulmonary effort is normal.     Breath sounds: Normal breath sounds.  Abdominal:     General: Abdomen is flat. Bowel sounds are normal. There is no distension.     Palpations: Abdomen is soft. There is no mass.  Musculoskeletal: Normal range of motion.        General: No swelling.     Right lower leg: No edema.     Left lower leg: No edema.  Skin:    General: Skin is warm and dry.   Neurological:     Mental Status: She is alert and oriented to person, place, and time.  Psychiatric:        Mood and Affect: Mood normal.        Behavior: Behavior normal.        Thought Content: Thought content normal.        Judgment: Judgment normal.     ASSESSMENT/PLAN: 1. Other fatigue - CBC with Differential - Iron, TIBC and Ferritin Panel - TSH - Epstein-Barr Virus PCR - VITAMIN D 25 Hydroxy (Vit-D Deficiency, Fractures) - Vitamin B12 - Comprehensive metabolic panel  2. Constipation, unspecified constipation type Encouraged to increase water intake.  - docusate sodium (COLACE) 100 MG capsule; Take 1 capsule (100 mg total) by mouth 2 (two) times daily.  Dispense: 10 capsule; Refill: 0  3. Language barrier to communication Interpreter services utilized throughout the encounter.   Return in about 3 months (around 05/20/2019) for Fatigue.    The patient was given clear instructions to go to ER or return to medical center if symptoms do not improve, worsen or new problems develop. The patient verbalized understanding and agreed with plan of care.   [name]. Brianna Lam, FNP-BC [facility]  [address] [place], [place] [address] [phone]

## 2019-02-25 ENCOUNTER — Telehealth: Payer: Self-pay

## 2019-02-25 DIAGNOSIS — J0110 Acute frontal sinusitis, unspecified: Secondary | ICD-10-CM

## 2019-02-25 MED ORDER — VITAMIN D (ERGOCALCIFEROL) 1.25 MG (50000 UNIT) PO CAPS: 50000.0000 [IU] | ORAL_CAPSULE | ORAL | 0 refills | Status: DC

## 2019-02-25 MED ORDER — FLUTICASONE PROPIONATE 50 MCG/ACT NA SUSP: 2.0000 | Freq: Every day | NASAL | 6 refills | Status: DC

## 2019-02-25 NOTE — Telephone Encounter (Signed)
Pt returned call and left message, RN called back with [name] interpreter via [employer] ID [phone]. Reviewed lab results with pt and notified of new medication. Pt expressed difficulty picking up nasal spray prescription, pt asked if pharmacy could be changed and med resent. Notified provider of pt wishes.    Notes recorded by Mike Gip, FNP on 02/25/2019 at 12:04 PM EDT Low Vitamin D. All other labs are stable. I will send Vit D to the pharmacy. After completion of the prescription, patient will need otc vitamin D of 1,000units daily.

## 2019-02-25 NOTE — Addendum Note (Signed)
Addended by: Reatha Harps on: 02/25/2019 12:05 PM   Modules accepted: Orders

## 2019-02-25 NOTE — Telephone Encounter (Signed)
Called Pt preferred, no answer, message left.   Notes recorded by Mike Gip, FNP on 02/25/2019 at 12:04 PM EDT Low Vitamin D. All other labs are stable. I will send Vit D to the pharmacy. After completion of the prescription, patient will need otc vitamin D of 1,000units daily.

## 2019-02-26 LAB — CBC WITH DIFFERENTIAL/PLATELET
Basophils Absolute: 0.1 10*3/uL (ref 0.0–0.2)
Basos: 1 %
EOS (ABSOLUTE): 0.1 10*3/uL (ref 0.0–0.4)
Eos: 2 %
Hematocrit: 39.4 % (ref 34.0–46.6)
Hemoglobin: 13.2 g/dL (ref 11.1–15.9)
Immature Grans (Abs): 0 10*3/uL (ref 0.0–0.1)
Immature Granulocytes: 0 %
Lymphocytes Absolute: 2.5 10*3/uL (ref 0.7–3.1)
Lymphs: 47 %
MCH: 30.8 pg (ref 26.6–33.0)
MCHC: 33.5 g/dL (ref 31.5–35.7)
MCV: 92 fL (ref 79–97)
Monocytes Absolute: 0.4 10*3/uL (ref 0.1–0.9)
Monocytes: 7 %
Neutrophils Absolute: 2.3 10*3/uL (ref 1.4–7.0)
Neutrophils: 43 %
Platelets: 283 10*3/uL (ref 150–450)
RBC: 4.28 x10E6/uL (ref 3.77–5.28)
RDW: 11.9 % (ref 11.7–15.4)
WBC: 5.4 10*3/uL (ref 3.4–10.8)

## 2019-02-26 LAB — COMPREHENSIVE METABOLIC PANEL
ALT: 13 IU/L (ref 0–32)
AST: 13 IU/L (ref 0–40)
Albumin/Globulin Ratio: 1.9 (ref 1.2–2.2)
Albumin: 5 g/dL (ref 3.9–5.0)
Alkaline Phosphatase: 61 IU/L (ref 39–117)
BUN/Creatinine Ratio: 16 (ref 9–23)
BUN: 10 mg/dL (ref 6–20)
Bilirubin Total: 0.7 mg/dL (ref 0.0–1.2)
CO2: 24 mmol/L (ref 20–29)
Calcium: 9.3 mg/dL (ref 8.7–10.2)
Chloride: 103 mmol/L (ref 96–106)
Creatinine, Ser: 0.64 mg/dL (ref 0.57–1.00)
GFR calc Af Amer: 142 mL/min/{1.73_m2} (ref >59)
GFR calc non Af Amer: 124 mL/min/{1.73_m2} (ref >59)
Globulin, Total: 2.6 g/dL (ref 1.5–4.5)
Glucose: 69 mg/dL (ref 65–99)
Potassium: 4.2 mmol/L (ref 3.5–5.2)
Sodium: 142 mmol/L (ref 134–144)
Total Protein: 7.6 g/dL (ref 6.0–8.5)

## 2019-02-26 LAB — IRON,TIBC AND FERRITIN PANEL
Ferritin: 84 ng/mL (ref 15–150)
Iron Saturation: 50 % (ref 15–55)
Iron: 143 ug/dL (ref 27–159)
Total Iron Binding Capacity: 288 ug/dL (ref 250–450)
UIBC: 145 ug/dL (ref 131–425)

## 2019-02-26 LAB — VITAMIN B12: Vitamin B-12: 530 pg/mL (ref 232–1245)

## 2019-02-26 LAB — EPSTEIN-BARR VIRUS PCR: Epstein-Barr Virus Real Time: NEGATIVE

## 2019-02-26 LAB — VITAMIN D 25 HYDROXY (VIT D DEFICIENCY, FRACTURES): Vit D, 25-Hydroxy: 12.8 ng/mL — ABNORMAL LOW (ref 30.0–100.0)

## 2019-02-26 LAB — TSH: TSH: 1.17 u[IU]/mL (ref 0.450–4.500)

## 2019-03-10 ENCOUNTER — Telehealth: Payer: Self-pay

## 2019-03-10 DIAGNOSIS — J0110 Acute frontal sinusitis, unspecified: Secondary | ICD-10-CM

## 2019-03-10 MED ORDER — VITAMIN D (ERGOCALCIFEROL) 1.25 MG (50000 UNIT) PO CAPS: 50000.0000 [IU] | ORAL_CAPSULE | ORAL | 0 refills | Status: AC

## 2019-03-10 MED ORDER — FLUTICASONE PROPIONATE 50 MCG/ACT NA SUSP: 2.0000 | Freq: Every day | NASAL | 6 refills | Status: DC

## 2019-03-10 NOTE — Telephone Encounter (Signed)
Medication sent to pharmacy  

## 2019-04-18 ENCOUNTER — Ambulatory Visit: Payer: Self-pay | Admitting: Cardiology

## 2019-04-19 ENCOUNTER — Ambulatory Visit: Payer: Self-pay | Admitting: Cardiology

## 2019-04-19 NOTE — Progress Notes (Deleted)
Cardiology Office Note:    Date:  04/19/2019   ID:  Brianna Haley, DOB 06/06/1991, MRN [id number]  PCP:  Mike Gipouglas, Andre, FNP  Cardiologist:  No primary care provider on file.  Electrophysiologist:  None   Referring MD: Mike Gipouglas, Andre, FNP   Here for the evaluation of possible abnormal EKG.  History of Present Illness:    Brianna Haley is a 28 y.o. female here for the evaluation of right bundle branch block at the request of Mike GipAndre Douglas, FNP.  [place].  In review of EKG from 02/09/2019- QRS complex was not greater than 120 ms, it was interpreted as incomplete right bundle branch block.  This is essentially a normal ECG.    Past Medical History:  Diagnosis Date  . Abdominal pain   . Body aches   . Chest discomfort   . Constipation   . Cough   . Fatigue   . Feeling of sadness   . Fever   . GERD (gastroesophageal reflux disease)   . Helicobacter pylori antibody positive   . History of spontaneous abortion   . Language barrier, cultural differences   . Malaise   . Maternal varicella, non-immune   . RBBB   . Sinusitis chronic, frontal   . SOB (shortness of breath)     No past surgical history on file.  Current Medications: No outpatient medications have been marked as taking for the 04/19/19 encounter (Appointment) with Jake BatheSkains, Myeesha Shane C, MD.     Allergies:   Patient has no known allergies.   Social History   Socioeconomic History  . Marital status: Married    Spouse name: Not on file  . Number of children: Not on file  . Years of education: Not on file  . Highest education level: Not on file  Occupational History  . Not on file  Social Needs  . Financial resource strain: Not on file  . Food insecurity:    Worry: Not on file    Inability: Not on file  . Transportation needs:    Medical: Not on file    Non-medical: Not on file  Tobacco Use  . Smoking status: Never Smoker  . Smokeless tobacco: Never Used  Substance and Sexual Activity  . Alcohol use: No  .  Drug use: No  . Sexual activity: Yes    Birth control/protection: None  Lifestyle  . Physical activity:    Days per week: Not on file    Minutes per session: Not on file  . Stress: Not on file  Relationships  . Social connections:    Talks on phone: Not on file    Gets together: Not on file    Attends religious service: Not on file    Active member of club or organization: Not on file    Attends meetings of clubs or organizations: Not on file    Relationship status: Not on file  Other Topics Concern  . Not on file  Social History Narrative  . Not on file     Family History: The patient's ***family history is not on file.  ROS:   Please see the history of present illness.    *** All other systems reviewed and are negative.  EKGs/Labs/Other Studies Reviewed:    The following studies were reviewed today: ***  EKG:  EKG is *** ordered today.  The ekg ordered today demonstrates ***  Recent Labs: 02/17/2019: ALT 13; BUN 10; Creatinine, Ser 0.64; Hemoglobin 13.2; Platelets 283; Potassium 4.2; Sodium 142;  TSH 1.170  Recent Lipid Panel No results found for: CHOL, TRIG, HDL, CHOLHDL, VLDL, LDLCALC, LDLDIRECT  Physical Exam:    VS:  There were no vitals taken for this visit.    Wt Readings from Last 3 Encounters:  02/17/19 116 lb (52.6 kg)  02/09/19 118 lb (53.5 kg)  02/02/19 117 lb 6.4 oz (53.3 kg)     GEN: *** Well nourished, well developed in no acute distress HEENT: Normal NECK: No JVD; No carotid bruits LYMPHATICS: No lymphadenopathy CARDIAC: ***RRR, no murmurs, rubs, gallops RESPIRATORY:  Clear to auscultation without rales, wheezing or rhonchi  ABDOMEN: Soft, non-tender, non-distended MUSCULOSKELETAL:  No edema; No deformity  SKIN: Warm and dry NEUROLOGIC:  Alert and oriented x 3 PSYCHIATRIC:  Normal affect   ASSESSMENT:    No diagnosis found. PLAN:    In order of problems listed above:  1. ***   Medication Adjustments/Labs and Tests Ordered:  Current medicines are reviewed at length with the patient today.  Concerns regarding medicines are outlined above.  No orders of the defined types were placed in this encounter.  No orders of the defined types were placed in this encounter.   There are no Patient Instructions on file for this visit.   Signed, Donato Schultz, MD  04/19/2019 1:16 PM    [facility]

## 2019-05-20 ENCOUNTER — Ambulatory Visit: Payer: Medicaid Other | Admitting: Family Medicine

## 2019-08-10 ENCOUNTER — Encounter (HOSPITAL_COMMUNITY): Payer: Self-pay

## 2019-08-10 ENCOUNTER — Encounter (HOSPITAL_COMMUNITY): Payer: Self-pay | Admitting: *Deleted

## 2019-10-11 ENCOUNTER — Other Ambulatory Visit: Payer: Self-pay

## 2019-10-11 ENCOUNTER — Ambulatory Visit (INDEPENDENT_AMBULATORY_CARE_PROVIDER_SITE_OTHER): Payer: Medicaid Other | Admitting: Advanced Practice Midwife

## 2019-10-11 ENCOUNTER — Encounter: Payer: Self-pay | Admitting: Advanced Practice Midwife

## 2019-10-11 ENCOUNTER — Other Ambulatory Visit (HOSPITAL_COMMUNITY)
Admission: RE | Admit: 2019-10-11 | Discharge: 2019-10-11 | Disposition: A | Discharge status: Home / Self Care | Payer: Medicaid Other | Source: Ambulatory Visit | Attending: Advanced Practice Midwife | Admitting: Advanced Practice Midwife

## 2019-10-11 VITALS — BP 94/59 | HR 61 | Temp 98.3°F | Ht 63.5 in | Wt 119.4 lb

## 2019-10-11 DIAGNOSIS — Z30431 Encounter for routine checking of intrauterine contraceptive device: Secondary | ICD-10-CM

## 2019-10-11 DIAGNOSIS — N3000 Acute cystitis without hematuria: Secondary | ICD-10-CM

## 2019-10-11 DIAGNOSIS — R300 Dysuria: Secondary | ICD-10-CM

## 2019-10-11 DIAGNOSIS — K5900 Constipation, unspecified: Secondary | ICD-10-CM

## 2019-10-11 DIAGNOSIS — Z113 Encounter for screening for infections with a predominantly sexual mode of transmission: Secondary | ICD-10-CM

## 2019-10-11 DIAGNOSIS — R350 Frequency of micturition: Secondary | ICD-10-CM

## 2019-10-11 DIAGNOSIS — Z3202 Encounter for pregnancy test, result negative: Secondary | ICD-10-CM

## 2019-10-11 LAB — POCT URINALYSIS DIPSTICK
Bilirubin, UA: NEGATIVE
Blood, UA: NEGATIVE
Glucose, UA: NEGATIVE
Ketones, UA: NEGATIVE
Nitrite, UA: NEGATIVE
Protein, UA: NEGATIVE
Spec Grav, UA: 1.015 (ref 1.010–1.025)
Urobilinogen, UA: 0.2 E.U./dL
pH, UA: 5 (ref 5.0–8.0)

## 2019-10-11 LAB — POCT URINE PREGNANCY: Preg Test, Ur: NEGATIVE

## 2019-10-11 MED ORDER — SULFAMETHOXAZOLE-TRIMETHOPRIM 400-80 MG PO TABS: 1.0000 | ORAL_TABLET | Freq: Two times a day (BID) | ORAL | 0 refills | Status: AC

## 2019-10-11 NOTE — Progress Notes (Addendum)
GYN presents for AEX/PAP/STD Testing.  She got the FLU vaccine 09/2019. C/o urinary frequency, burning and pain 5-6/10 that radiates to her back x 1 month.  She c/o constipation, only drinks 1-2 bottles of water per day.  GAD-7=8

## 2019-10-11 NOTE — Progress Notes (Signed)
  GYNECOLOGY PROGRESS NOTE  History:  28 y.o. G1P1001 presents to [place] office today for problem gyn visit. She reports some urinary frequency, burning with urination and abdominal cramping with constipation.  She has an IUD in place and wants to know if it can cause these symptoms.  She denies h/a, dizziness, shortness of breath, n/v, or fever/chills.    The following portions of the patient's history were reviewed and updated as appropriate: allergies, current medications, past family history, past medical history, past social history, past surgical history and problem list. Last pap smear on 08/25/18 was normal.  Review of Systems:  Pertinent items are noted in HPI.   Objective:  Physical Exam Blood pressure (!) 94/59, pulse 61, temperature 98.3 F (36.8 C), height 5' 3.5" (1.613 m), weight 119 lb 6.4 oz (54.2 kg), last menstrual period 08/29/2019. VS reviewed, nursing note reviewed,  Constitutional: well developed, well nourished, no distress HEENT: normocephalic CV: normal rate Pulm/chest wall: normal effort Breast Exam: deferred Abdomen: soft Neuro: alert and oriented x 3 Skin: warm, dry Psych: affect normal Pelvic exam: Vaginal swab collected by blind swabvaginal walls and external genitalia normal Bimanual exam: Cervix 0/long/high, firm, anterior, neg CMT, uterus nontender, nonenlarged, adnexa without tenderness, enlargement, or mass  Assessment & Plan:  1. Screening examination for STD (1. Screening examination for STD (sexually transmitted disease) --Pt with pelvic/vaginal symptoms so screening for GC done today - POCT Urinalysis Dipstick - Urine Culture - Cervicovaginal ancillary only( [facility])  2. Family planning, IUD (intrauterine device) check/reinsertion/removal --IUD string palpated on bimanual exam --If symptoms not improved with treatment of UTI and constipation, pt to return to office in 3 months for another IUD check visit  3. Acute cystitis without  hematuria --Urine culture pending but symptoms c/w UTI and pt with + leukocytes in today's UA - sulfamethoxazole-trimethoprim (BACTRIM) 400-80 MG tablet; Take 1 tablet by mouth 2 (two) times daily for 7 days.  Dispense: 14 tablet; Refill: 0   4. Constipation, unspecified constipation type --Pt reports BM 3-4 times weekly but straining and sometimes scant bleeding after BM.  Encouraged pt to add more fiber to diet and drink more water.  --Discussed high fiber foods and use of fiber additives like Metamucil PRN. --Printed information about high fiber diet and Metamucil given.  5. Urinary frequency --UPT negative today - POCT urine pregnancy - POCT urine pregnancy  6. Dysuria --Likely UTI, STD screening pending.    Fatima Blank, CNM 10:47 AM

## 2019-10-11 NOTE — Patient Instructions (Signed)
Try METAMUCIL for constipation with the high fiber foods below.  High-Fiber Diet Fiber, also called dietary fiber, is a type of carbohydrate that is found in fruits, vegetables, whole grains, and beans. A high-fiber diet can have many health benefits. Your health care provider may recommend a high-fiber diet to help:  Prevent constipation. Fiber can make your bowel movements more regular.  Lower your cholesterol.  Relieve the following conditions: ? Swelling of veins in the anus (hemorrhoids). ? Swelling and irritation (inflammation) of specific areas of the digestive tract (uncomplicated diverticulosis). ? A problem of the large intestine (colon) that sometimes causes pain and diarrhea (irritable bowel syndrome, IBS).  Prevent overeating as part of a weight-loss plan.  Prevent heart disease, type 2 diabetes, and certain cancers. What is my plan? The recommended daily fiber intake in grams (g) includes:  38 g for men age 56 or younger.  30 g for men over age 40.  58 g for women age 5 or younger.  21 g for women over age 58. You can get the recommended daily intake of dietary fiber by:  Eating a variety of fruits, vegetables, grains, and beans.  Taking a fiber supplement, if it is not possible to get enough fiber through your diet. What do I need to know about a high-fiber diet?  It is better to get fiber through food sources rather than from fiber supplements. There is not a lot of research about how effective supplements are.  Always check the fiber content on the nutrition facts label of any prepackaged food. Look for foods that contain 5 g of fiber or more per serving.  Talk with a diet and nutrition specialist (dietitian) if you have questions about specific foods that are recommended or not recommended for your medical condition, especially if those foods are not listed below.  Gradually increase how much fiber you consume. If you increase your intake of dietary fiber  too quickly, you may have bloating, cramping, or gas.  Drink plenty of water. Water helps you to digest fiber. What are tips for following this plan?  Eat a wide variety of high-fiber foods.  Make sure that half of the grains that you eat each day are whole grains.  Eat breads and cereals that are made with whole-grain flour instead of refined flour or white flour.  Eat brown rice, bulgur wheat, or millet instead of white rice.  Start the day with a breakfast that is high in fiber, such as a cereal that contains 5 g of fiber or more per serving.  Use beans in place of meat in soups, salads, and pasta dishes.  Eat high-fiber snacks, such as berries, raw vegetables, nuts, and popcorn.  Choose whole fruits and vegetables instead of processed forms like juice or sauce. What foods can I eat?  Fruits Berries. Pears. Apples. Oranges. Avocado. Prunes and raisins. Dried figs. Vegetables Sweet potatoes. Spinach. Kale. Artichokes. Cabbage. Broccoli. Cauliflower. Green peas. Carrots. Squash. Grains Whole-grain breads. Multigrain cereal. Oats and oatmeal. Brown rice. Barley. Bulgur wheat. [place]. Quinoa. Bran muffins. Popcorn. Rye wafer crackers. Meats and other proteins Navy, kidney, and pinto beans. Soybeans. Split peas. Lentils. Nuts and seeds. Dairy Fiber-fortified yogurt. Beverages Fiber-fortified soy milk. Fiber-fortified orange juice. Other foods Fiber bars. The items listed above may not be a complete list of recommended foods and beverages. Contact a dietitian for more options. What foods are not recommended? Fruits Fruit juice. Cooked, strained fruit. Vegetables Fried potatoes. Canned vegetables. Well-cooked vegetables. Grains White  bread. Pasta made with refined flour. White rice. Meats and other proteins Fatty cuts of meat. Fried chicken or fried fish. Dairy Milk. Yogurt. Cream cheese. Sour cream. Fats and oils Butters. Beverages Soft drinks. Other foods Cakes and  pastries. The items listed above may not be a complete list of foods and beverages to avoid. Contact a dietitian for more information. Summary  Fiber is a type of carbohydrate. It is found in fruits, vegetables, whole grains, and beans.  There are many health benefits of eating a high-fiber diet, such as preventing constipation, lowering blood cholesterol, helping with weight loss, and reducing your risk of heart disease, diabetes, and certain cancers.  Gradually increase your intake of fiber. Increasing too fast can result in cramping, bloating, and gas. Drink plenty of water while you increase your fiber.  The best sources of fiber include whole fruits and vegetables, whole grains, nuts, seeds, and beans. This information is not intended to replace advice given to you by your health care provider. Make sure you discuss any questions you have with your health care provider. Document Released: 11/17/2005 Document Revised: 09/21/2017 Document Reviewed: 09/21/2017 Elsevier Patient Education  2020 [employer].

## 2019-10-12 LAB — CERVICOVAGINAL ANCILLARY ONLY
Chlamydia: NEGATIVE
Comment: NEGATIVE
Comment: NORMAL
Neisseria Gonorrhea: NEGATIVE

## 2019-10-13 LAB — URINE CULTURE

## 2020-01-11 ENCOUNTER — Encounter: Payer: Self-pay | Admitting: Nurse Practitioner

## 2020-01-11 ENCOUNTER — Other Ambulatory Visit: Payer: Self-pay

## 2020-01-11 ENCOUNTER — Ambulatory Visit (INDEPENDENT_AMBULATORY_CARE_PROVIDER_SITE_OTHER): Payer: Self-pay | Admitting: Nurse Practitioner

## 2020-01-11 VITALS — BP 111/66 | HR 80 | Temp 98.1°F | Resp 14 | Ht 63.5 in | Wt 113.0 lb

## 2020-01-11 DIAGNOSIS — R109 Unspecified abdominal pain: Secondary | ICD-10-CM

## 2020-01-11 DIAGNOSIS — Z0000 Encounter for general adult medical examination without abnormal findings: Secondary | ICD-10-CM

## 2020-01-11 DIAGNOSIS — R21 Rash and other nonspecific skin eruption: Secondary | ICD-10-CM

## 2020-01-11 DIAGNOSIS — K5900 Constipation, unspecified: Secondary | ICD-10-CM

## 2020-01-11 LAB — POCT URINALYSIS DIPSTICK
Bilirubin, UA: NEGATIVE
Glucose, UA: NEGATIVE
Ketones, UA: NEGATIVE
Leukocytes, UA: NEGATIVE
Nitrite, UA: NEGATIVE
Protein, UA: NEGATIVE
Spec Grav, UA: 1.01 (ref 1.010–1.025)
Urobilinogen, UA: 0.2 E.U./dL
pH, UA: 6.5 (ref 5.0–8.0)

## 2020-01-11 NOTE — Progress Notes (Signed)
Established Patient Office Visit  Subjective:  Patient ID: Brianna Haley, female    DOB: 07-20-1991  Age: 29 y.o. MRN: [id number]  CC:  Chief Complaint  Patient presents with  . Abdominal Pain    burning sensation in abdomen (mid)     HPI Brianna Haley presents for follow-up.  She has a history of GERD and abdominal pain.  She admits for the past 2 months she has been having some abdominal pain.  She describes it as a constant burning sensation.  She is also having some cramping.  She does not feel like he is related to eating.  It can happen when she is sitting.  She did have some nausea and vomiting in December after eating sushi.  She denies any other episodes of vomiting.  She has been having some nausea.  She does get bloated and have increased gas.  She has also noticed that she felt a little lightheaded.  She does have occasional pain in her neck and chest.  Her last bowel movement was yesterday.  She does have history of some constipation.  She did notice dark stools.  She denies any bright red blood.  She has taken over-the-counter Pepto-Bismol.  She did not feel this was effective.  She denies any fever.  She does have occasional chills.  She has noticed sores on her tongue.  She denies any urinary symptoms burning, stinging or frequency.  She does try to drink 2-3 bottles of water per day.  She does not eat fruits and vegetables. She admits that she has been under some stress.  She feels like she is able to manage the stress.  Past Medical History:  Diagnosis Date  . Abdominal pain   . Body aches   . Chest discomfort   . Constipation   . Cough   . Fatigue   . Feeling of sadness   . Fever   . GERD (gastroesophageal reflux disease)   . Helicobacter pylori antibody positive   . History of spontaneous abortion   . Language barrier, cultural differences   . Malaise   . Maternal varicella, non-immune   . RBBB   . Sinusitis chronic, frontal   . SOB (shortness of breath)      History reviewed. No pertinent surgical history.  History reviewed. No pertinent family history.  Social History   Socioeconomic History  . Marital status: Married    Spouse name: Not on file  . Number of children: Not on file  . Years of education: Not on file  . Highest education level: Not on file  Occupational History  . Not on file  Tobacco Use  . Smoking status: Never Smoker  . Smokeless tobacco: Never Used  Substance and Sexual Activity  . Alcohol use: No  . Drug use: No  . Sexual activity: Yes    Birth control/protection: None  Other Topics Concern  . Not on file  Social History Narrative  . Not on file   Social Determinants of Health   Financial Resource Strain:   . Difficulty of Paying Living Expenses: Not on file  Food Insecurity:   . Worried About Programme researcher, broadcasting/film/video in the Last Year: Not on file  . Ran Out of Food in the Last Year: Not on file  Transportation Needs:   . Lack of Transportation (Medical): Not on file  . Lack of Transportation (Non-Medical): Not on file  Physical Activity:   . Days of Exercise per Week: Not  on file  . Minutes of Exercise per Session: Not on file  Stress:   . Feeling of Stress : Not on file  Social Connections:   . Frequency of Communication with Friends and Family: Not on file  . Frequency of Social Gatherings with Friends and Family: Not on file  . Attends Religious Services: Not on file  . Active Member of Clubs or Organizations: Not on file  . Attends Archivist Meetings: Not on file  . Marital Status: Not on file  Intimate Partner Violence:   . Fear of Current or Ex-Partner: Not on file  . Emotionally Abused: Not on file  . Physically Abused: Not on file  . Sexually Abused: Not on file    Outpatient Medications Prior to Visit  Medication Sig Dispense Refill  . amoxicillin-clavulanate (AUGMENTIN) 875-125 MG tablet Take 1 tablet by mouth 2 (two) times daily. (Patient not taking: Reported on  10/11/2019) 20 tablet 0  . docusate sodium (COLACE) 100 MG capsule Take 1 capsule (100 mg total) by mouth 2 (two) times daily. (Patient not taking: Reported on 10/11/2019) 10 capsule 0  . famotidine (PEPCID) 20 MG tablet Take 1 tablet (20 mg total) by mouth 2 (two) times daily. (Patient not taking: Reported on 10/11/2019) 60 tablet 2  . fluticasone (FLONASE) 50 MCG/ACT nasal spray Place 2 sprays into both nostrils daily. (Patient not taking: Reported on 10/11/2019) 16 g 6  . Multiple Vitamins-Iron (DAILY MULTIVITAMINS/IRON PO) Take by mouth daily.    . norethindrone (MICRONOR) 0.35 MG tablet Take 1 tablet by mouth daily.    . ondansetron (ZOFRAN) 4 MG tablet Take 4 mg by mouth every 8 (eight) hours as needed for nausea or vomiting.    . ranitidine (ZANTAC) 150 MG capsule Take 150 mg by mouth 2 (two) times daily.    . sodium chloride (OCEAN) 0.65 % SOLN nasal spray Place 1 spray into both nostrils as needed for congestion. (Patient not taking: Reported on 10/11/2019) 50 mL 0   No facility-administered medications prior to visit.    No Known Allergies  ROS Review of Systems  Constitutional: Positive for unexpected weight change.       Down 7 pounds   HENT: Positive for mouth sores.   Eyes: Negative.   Respiratory: Positive for shortness of breath.   Gastrointestinal:       Bloated gas   Endocrine: Negative.   Genitourinary: Negative.   Musculoskeletal: Negative.   Skin:       discoloration  Allergic/Immunologic: Negative.   Neurological: Negative.   Hematological: Negative.   Psychiatric/Behavioral:       Increased stress      Objective:    Physical Exam  Constitutional: She is oriented to person, place, and time. She appears well-developed and well-nourished.  HENT:  Head: Normocephalic.  Eyes: Pupils are equal, round, and reactive to light.  Cardiovascular: Normal rate, regular rhythm and normal heart sounds.  Pulmonary/Chest: Effort normal and breath sounds normal.   Abdominal: Soft. Bowel sounds are normal. There is abdominal tenderness.  Right middle abdomen no referred pain  Musculoskeletal:        General: Normal range of motion.     Cervical back: Normal range of motion and neck supple.  Neurological: She is alert and oriented to person, place, and time.  Skin: Skin is warm and dry. Rash noted.  Bilateral hands right greater than left  Psychiatric: She has a normal mood and affect. Her behavior is normal. Judgment  and thought content normal.    BP 111/66 (BP Location: Left Arm, Patient Position: Sitting, Cuff Size: Normal)   Pulse 80   Temp 98.1 F (36.7 C) (Oral)   Resp 14   Ht 5' 3.5" (1.613 m)   Wt 113 lb (51.3 kg)   SpO2 100%   BMI 19.70 kg/m  Wt Readings from Last 3 Encounters:  01/11/20 113 lb (51.3 kg)  10/11/19 119 lb 6.4 oz (54.2 kg)  02/17/19 116 lb (52.6 kg)     Health Maintenance Due  Topic Date Due  . TETANUS/TDAP  03/03/2011    There are no preventive care reminders to display for this patient.  Lab Results  Component Value Date   TSH 1.170 02/17/2019   Lab Results  Component Value Date   WBC 5.4 02/17/2019   HGB 13.2 02/17/2019   HCT 39.4 02/17/2019   MCV 92 02/17/2019   PLT 283 02/17/2019   Lab Results  Component Value Date   NA 142 02/17/2019   K 4.2 02/17/2019   CO2 24 02/17/2019   GLUCOSE 69 02/17/2019   BUN 10 02/17/2019   CREATININE 0.64 02/17/2019   BILITOT 0.7 02/17/2019   ALKPHOS 61 02/17/2019   AST 13 02/17/2019   ALT 13 02/17/2019   PROT 7.6 02/17/2019   ALBUMIN 5.0 02/17/2019   CALCIUM 9.3 02/17/2019   ANIONGAP 9 08/16/2018   No results found for: CHOL No results found for: HDL No results found for: LDLCALC No results found for: TRIG No results found for: CHOLHDL No results found for: EAVW0J    Assessment & Plan:   Problem List Items Addressed This Visit    None    Visit Diagnoses    Abdominal pain in female    -  Primary   Labs pending If labs are normal will  consider abdominal ultrasound   Relevant Orders   CBC with Differential/Platelet   TSH   H. pylori breath test   Comprehensive metabolic panel   Comp. Metabolic Panel (12)   Urinalysis Dipstick (Completed)   Healthcare maintenance       Relevant Orders   Vitamin D, 25-hydroxy   Vitamin B12   Constipation, unspecified constipation type       Encourage more balanced diet Encouraged increase water intake  encourage stool softener   Rash of both hands       Encourage Eucerin cream Encouraged the use of gloves      No orders of the defined types were placed in this encounter.   Follow-up: Return in about 3 months (around 04/09/2020).    Barbette Merino, NP

## 2020-01-11 NOTE — Patient Instructions (Addendum)
Abdominal Pain, Adult Many things can cause belly (abdominal) pain. Most times, belly pain is not dangerous. Many cases of belly pain can be watched and treated at home. Sometimes, though, belly pain is serious. Your doctor will try to find the cause of your belly pain. Follow these instructions at home:  Medicines  Take over-the-counter and prescription medicines only as told by your doctor.  Do not take medicines that help you poop (laxatives) unless told by your doctor. General instructions  Watch your belly pain for any changes.  Drink enough fluid to keep your pee (urine) pale yellow.  Keep all follow-up visits as told by your doctor. This is important. Contact a doctor if:  Your belly pain changes or gets worse.  You are not hungry, or you lose weight without trying.  You are having trouble pooping (constipated) or have watery poop (diarrhea) for more than 2-3 days.  You have pain when you pee or poop.  Your belly pain wakes you up at night.  Your pain gets worse with meals, after eating, or with certain foods.  You are vomiting and cannot keep anything down.  You have a fever.  You have blood in your pee. Get help right away if:  Your pain does not go away as soon as your doctor says it should.  You cannot stop vomiting.  Your pain is only in areas of your belly, such as the right side or the left lower part of the belly.  You have bloody or black poop, or poop that looks like tar.  You have very bad pain, cramping, or bloating in your belly.  You have signs of not having enough fluid or water in your body (dehydration), such as: ? Dark pee, very little pee, or no pee. ? Cracked lips. ? Dry mouth. ? Sunken eyes. ? Sleepiness. ? Weakness.  You have trouble breathing or chest pain. Summary  Many cases of belly pain can be watched and treated at home.  Watch your belly pain for any changes.  Take over-the-counter and prescription medicines only as  told by your doctor.  Contact a doctor if your belly pain changes or gets worse.  Get help right away if you have very bad pain, cramping, or bloating in your belly. This information is not intended to replace advice given to you by your health care provider. Make sure you discuss any questions you have with your health care provider. Document Revised: 03/28/2019 Document Reviewed: 03/28/2019 Elsevier Patient Education  2020 Elsevier Inc.  Constipation, Adult Constipation is when a person:  Poops (has a bowel movement) fewer times in a week than normal.  Has a hard time pooping.  Has poop that is dry, hard, or bigger than normal. Follow these instructions at home: Eating and drinking   Eat foods that have a lot of fiber, such as: ? Fresh fruits and vegetables. ? Whole grains. ? Beans.  Eat less of foods that are high in fat, low in fiber, or overly processed, such as: ? French fries. ? Hamburgers. ? Cookies. ? Candy. ? Soda.  Drink enough fluid to keep your pee (urine) clear or pale yellow. General instructions  Exercise regularly or as told by your doctor.  Go to the restroom when you feel like you need to poop. Do not hold it in.  Take over-the-counter and prescription medicines only as told by your doctor. These include any fiber supplements.  Do pelvic floor retraining exercises, such as: ? Doing deep   breathing while relaxing your lower belly (abdomen). ? Relaxing your pelvic floor while pooping.  Watch your condition for any changes.  Keep all follow-up visits as told by your doctor. This is important. Contact a doctor if:  You have pain that gets worse.  You have a fever.  You have not pooped for 4 days.  You throw up (vomit).  You are not hungry.  You lose weight.  You are bleeding from the anus.  You have thin, pencil-like poop (stool). Get help right away if:  You have a fever, and your symptoms suddenly get worse.  You leak poop or have  blood in your poop.  Your belly feels hard or bigger than normal (is bloated).  You have very bad belly pain.  You feel dizzy or you faint. This information is not intended to replace advice given to you by your health care provider. Make sure you discuss any questions you have with your health care provider. Document Revised: 10/30/2017 Document Reviewed: 05/07/2016 Elsevier Patient Education  2020 Elsevier Inc.  

## 2020-01-12 ENCOUNTER — Other Ambulatory Visit: Payer: Self-pay

## 2020-01-12 ENCOUNTER — Encounter: Payer: Self-pay | Admitting: Nurse Practitioner

## 2020-01-12 ENCOUNTER — Telehealth: Payer: Self-pay

## 2020-01-12 ENCOUNTER — Other Ambulatory Visit: Payer: Self-pay | Admitting: Nurse Practitioner

## 2020-01-12 DIAGNOSIS — E559 Vitamin D deficiency, unspecified: Secondary | ICD-10-CM | POA: Insufficient documentation

## 2020-01-12 HISTORY — DX: Vitamin D deficiency, unspecified: E55.9

## 2020-01-12 LAB — COMPREHENSIVE METABOLIC PANEL
ALT: 13 IU/L (ref 0–32)
AST: 12 IU/L (ref 0–40)
Albumin/Globulin Ratio: 1.9 (ref 1.2–2.2)
Albumin: 5 g/dL (ref 3.9–5.0)
Alkaline Phosphatase: 60 IU/L (ref 39–117)
BUN/Creatinine Ratio: 16 (ref 9–23)
BUN: 10 mg/dL (ref 6–20)
Bilirubin Total: 0.7 mg/dL (ref 0.0–1.2)
CO2: 22 mmol/L (ref 20–29)
Calcium: 9.9 mg/dL (ref 8.7–10.2)
Chloride: 103 mmol/L (ref 96–106)
Creatinine, Ser: 0.61 mg/dL (ref 0.57–1.00)
GFR calc Af Amer: 144 mL/min/{1.73_m2} (ref >59)
GFR calc non Af Amer: 125 mL/min/{1.73_m2} (ref >59)
Globulin, Total: 2.6 g/dL (ref 1.5–4.5)
Glucose: 80 mg/dL (ref 65–99)
Potassium: 4.2 mmol/L (ref 3.5–5.2)
Sodium: 139 mmol/L (ref 134–144)
Total Protein: 7.6 g/dL (ref 6.0–8.5)

## 2020-01-12 LAB — CBC WITH DIFFERENTIAL/PLATELET
Basophils Absolute: 0.1 10*3/uL (ref 0.0–0.2)
Basos: 1 %
EOS (ABSOLUTE): 0.1 10*3/uL (ref 0.0–0.4)
Eos: 2 %
Hematocrit: 40.1 % (ref 34.0–46.6)
Hemoglobin: 13.4 g/dL (ref 11.1–15.9)
Immature Grans (Abs): 0 10*3/uL (ref 0.0–0.1)
Immature Granulocytes: 0 %
Lymphocytes Absolute: 2 10*3/uL (ref 0.7–3.1)
Lymphs: 47 %
MCH: 30.9 pg (ref 26.6–33.0)
MCHC: 33.4 g/dL (ref 31.5–35.7)
MCV: 93 fL (ref 79–97)
Monocytes Absolute: 0.3 10*3/uL (ref 0.1–0.9)
Monocytes: 6 %
Neutrophils Absolute: 1.9 10*3/uL (ref 1.4–7.0)
Neutrophils: 44 %
Platelets: 293 10*3/uL (ref 150–450)
RBC: 4.33 x10E6/uL (ref 3.77–5.28)
RDW: 11.5 % — ABNORMAL LOW (ref 11.7–15.4)
WBC: 4.3 10*3/uL (ref 3.4–10.8)

## 2020-01-12 LAB — VITAMIN D 25 HYDROXY (VIT D DEFICIENCY, FRACTURES): Vit D, 25-Hydroxy: 16 ng/mL — ABNORMAL LOW (ref 30.0–100.0)

## 2020-01-12 LAB — VITAMIN B12: Vitamin B-12: 586 pg/mL (ref 232–1245)

## 2020-01-12 LAB — TSH: TSH: 1.07 u[IU]/mL (ref 0.450–4.500)

## 2020-01-12 MED ORDER — ERGOCALCIFEROL 1.25 MG (50000 UT) PO CAPS: 50000.0000 [IU] | ORAL_CAPSULE | ORAL | 0 refills | Status: DC

## 2020-01-12 MED ORDER — ERGOCALCIFEROL 1.25 MG (50000 UT) PO CAPS: 50000.0000 [IU] | ORAL_CAPSULE | ORAL | 0 refills | Status: AC

## 2020-01-12 NOTE — Telephone Encounter (Signed)
Called using Interpreter [id number]. Advised that vitamin D levels were low and asked that she take vitamin D 50,000 units once weekly for 12 weeks, when completed asked that she take 2000 units daily. Advised that we will recheck vitamin D in 1 year. Advised that all other labs are normal however we are still waiting on Breath test and will call her with those results.

## 2020-01-12 NOTE — Telephone Encounter (Signed)
-----   Message from Barbette Merino, NP sent at 01/12/2020  8:43 AM EST ----- H. pylori breath test pendingVitamin D deficiency 50,000 units weekly x12 and have patient to take at least 2000 units daily we will recheck vitamin D level in 1 yearAll other labs within normal range

## 2020-01-13 ENCOUNTER — Telehealth: Payer: Self-pay

## 2020-01-13 ENCOUNTER — Other Ambulatory Visit: Payer: Self-pay | Admitting: Nurse Practitioner

## 2020-01-13 LAB — H. PYLORI BREATH TEST: H pylori Breath Test: NEGATIVE

## 2020-01-13 MED ORDER — ESOMEPRAZOLE MAGNESIUM 20 MG PO CPDR: 20.0000 mg | DELAYED_RELEASE_CAPSULE | Freq: Every day | ORAL | 11 refills | Status: DC

## 2020-01-13 NOTE — Telephone Encounter (Signed)
-----   Message from Barbette Merino, NP sent at 01/13/2020 11:45 AM EST ----- Regarding: lab Please make Ms. Sans aware that her H. pylori breath test was negative.  I would encourage lifestyle modifications watching the amount of caffeine, chocolate, spicy foods foods high in fat, carbonated beveragesand peppermint.   She may want to get back on one of the treatment for GERD. It looks like she has been on Pepcid, Protonix, Zantac and sucralfate.

## 2020-01-13 NOTE — Telephone Encounter (Signed)
Called and spoke with patient using [id number]. Advised that H pylori breath test was negative and encouraged pt to make diet modifications including limiting caffeine, chocolate, spicy foods, foods high in fat, carbonated drinks, and peppermint. Advised that she may want to go back on treatment for GERD and she did agree to this. I will ask provider to send in medication to pharmacy. Thanks!

## 2020-03-02 IMAGING — DX DG CHEST 2V
2 series · 2 of 2 positions shown · non-contrast
Comparison: None.

CLINICAL DATA: Mid chest pain and upper back pain with cough x1
month

EXAM:
CHEST - 2 VIEW

[chest pa]
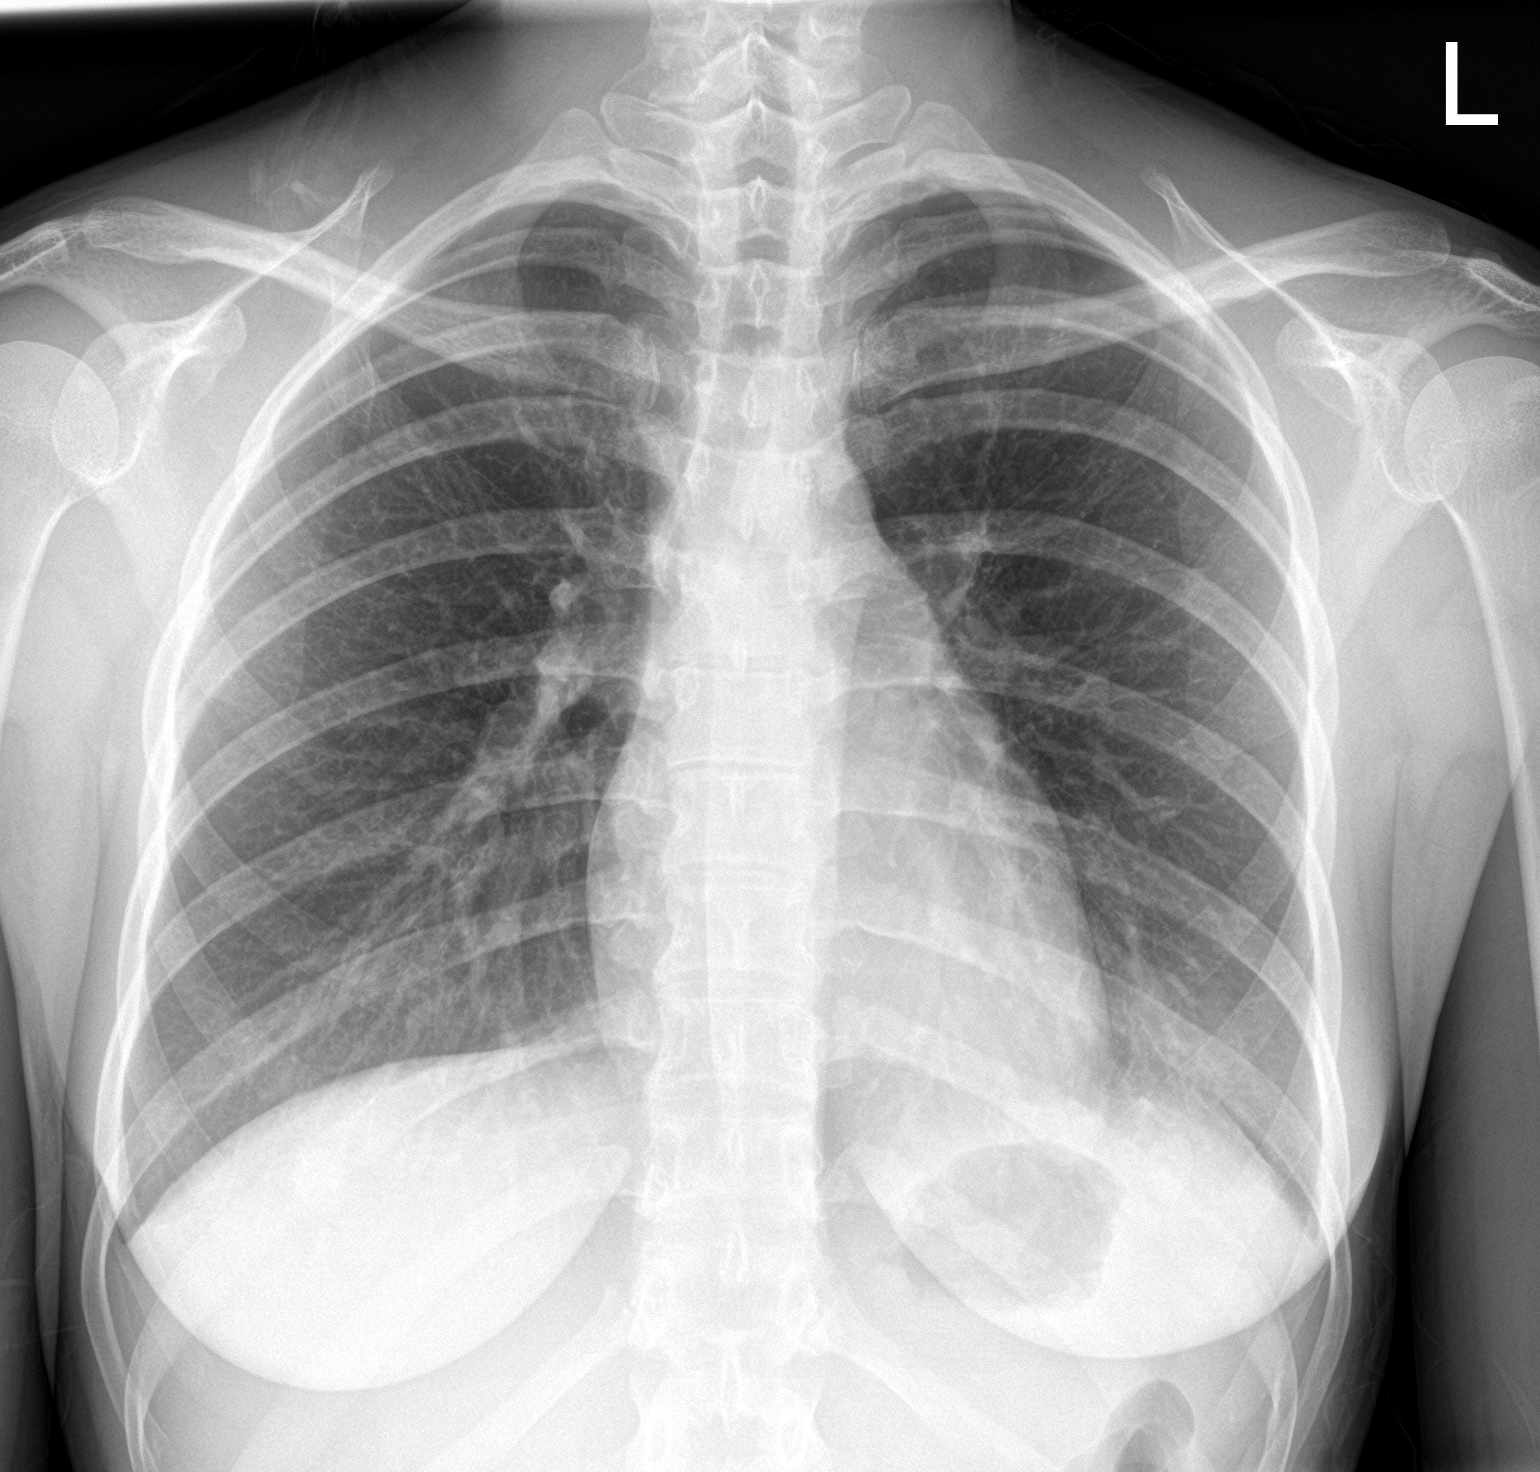

[chest lat]
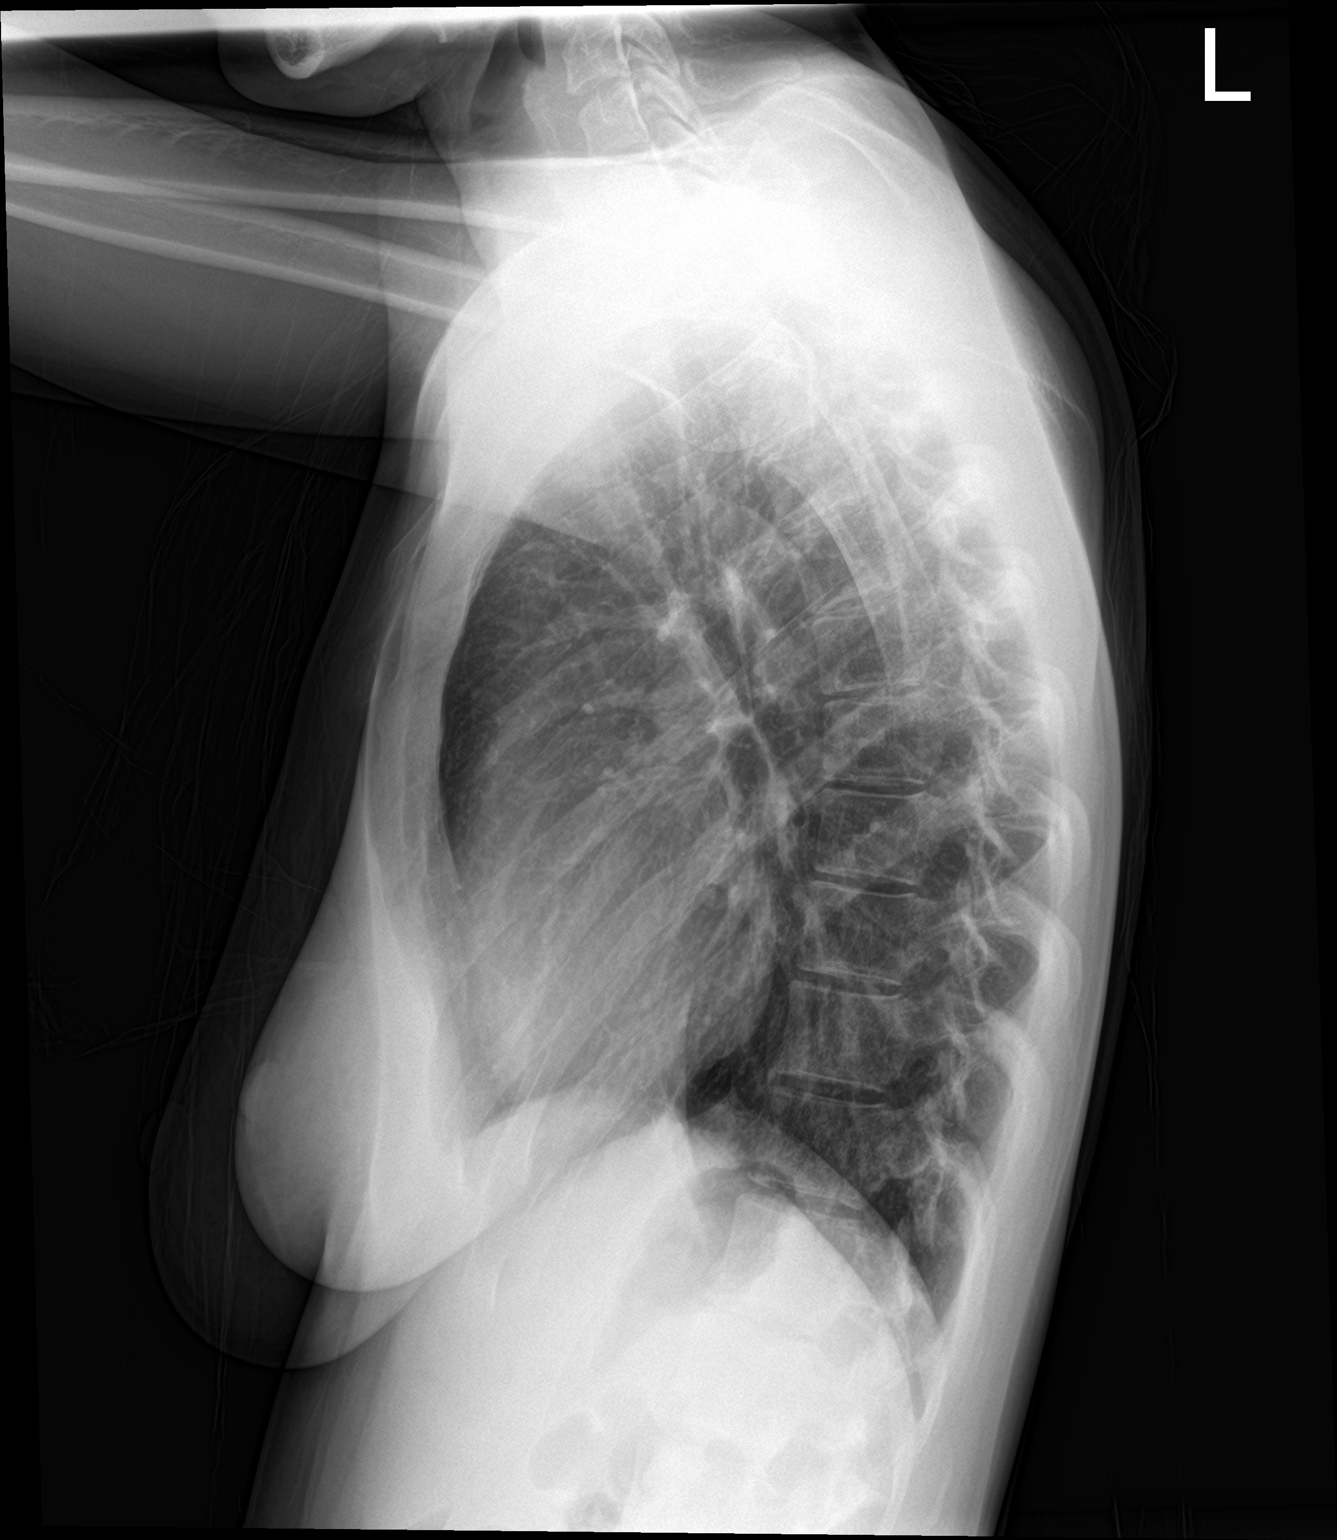

[2 of 2 positions shown; findings below may reference images not displayed]

FINDINGS: The heart size and mediastinal contours are within normal limits.
Subtle opacities in the lingula suspicious for pneumonia and/or
atelectasis. The visualized skeletal structures are unremarkable.
IMPRESSION: Subtle lingular opacities suspicious for pneumonia and/or
atelectasis.

## 2020-03-05 ENCOUNTER — Encounter: Payer: Self-pay | Admitting: *Deleted

## 2020-03-05 ENCOUNTER — Ambulatory Visit (INDEPENDENT_AMBULATORY_CARE_PROVIDER_SITE_OTHER): Payer: Medicaid Other | Admitting: Obstetrics & Gynecology

## 2020-03-05 ENCOUNTER — Encounter: Payer: Self-pay | Admitting: Obstetrics & Gynecology

## 2020-03-05 ENCOUNTER — Other Ambulatory Visit: Payer: Self-pay

## 2020-03-05 VITALS — BP 95/61 | HR 86 | Wt 116.0 lb

## 2020-03-05 DIAGNOSIS — Z30432 Encounter for removal of intrauterine contraceptive device: Secondary | ICD-10-CM | POA: Diagnosis not present

## 2020-03-05 NOTE — Progress Notes (Signed)
Cc:  Presents for IUD removal, patient wants to have another baby.  29 y.o. G1P1001 No LMP recorded. (Menstrual status: IUD). Mirena in place for 3 years. She requests removal and wants to conceive.    Past Medical History:  Diagnosis Date  . Abdominal pain   . Body aches   . Chest discomfort   . Constipation   . Cough   . Fatigue   . Feeling of sadness   . Fever   . GERD (gastroesophageal reflux disease)   . Helicobacter pylori antibody positive   . History of spontaneous abortion   . Language barrier, cultural differences   . Malaise   . Maternal varicella, non-immune   . RBBB   . Sinusitis chronic, frontal   . SOB (shortness of breath)    No past surgical history on file. No Known Allergies  Review of Systems  Constitutional: Negative.   Gastrointestinal: Negative.   Genitourinary: Positive for frequency.       Occasional pelvic pain   Blood pressure 95/61, pulse 86, weight 116 lb (52.6 kg). Pelvic exam: normal external genitalia, vulva, vagina, cervix, with strings visible. Time out for removal, device removed intact with no difficulty  Imp: Successful IUD removal Plan: Notify for prenatal care if she conceives  Adam Phenix, MD 03/05/2020

## 2020-03-05 NOTE — Patient Instructions (Signed)
Levonorgestrel intrauterine device (IUD) What is this medicine? LEVONORGESTREL IUD (LEE voe nor jes trel) is a contraceptive (birth control) device. The device is placed inside the uterus by a healthcare professional. It is used to prevent pregnancy. This device can also be used to treat heavy bleeding that occurs during your period. This medicine may be used for other purposes; ask your health care provider or pharmacist if you have questions. COMMON BRAND NAME(S): Kyleena, LILETTA, Mirena, Skyla What should I tell my health care provider before I take this medicine? They need to know if you have any of these conditions:  abnormal Pap smear  cancer of the breast, uterus, or cervix  diabetes  endometritis  genital or pelvic infection now or in the past  have more than one sexual partner or your partner has more than one partner  heart disease  history of an ectopic or tubal pregnancy  immune system problems  IUD in place  liver disease or tumor  problems with blood clots or take blood-thinners  seizures  use intravenous drugs  uterus of unusual shape  vaginal bleeding that has not been explained  an unusual or allergic reaction to levonorgestrel, other hormones, silicone, or polyethylene, medicines, foods, dyes, or preservatives  pregnant or trying to get pregnant  breast-feeding How should I use this medicine? This device is placed inside the uterus by a health care professional. Talk to your pediatrician regarding the use of this medicine in children. Special care may be needed. Overdosage: If you think you have taken too much of this medicine contact a poison control center or emergency room at once. NOTE: This medicine is only for you. Do not share this medicine with others. What if I miss a dose? This does not apply. Depending on the brand of device you have inserted, the device will need to be replaced every 3 to 6 years if you wish to continue using this type  of birth control. What may interact with this medicine? Do not take this medicine with any of the following medications:  amprenavir  bosentan  fosamprenavir This medicine may also interact with the following medications:  aprepitant  armodafinil  barbiturate medicines for inducing sleep or treating seizures  bexarotene  boceprevir  griseofulvin  medicines to treat seizures like carbamazepine, ethotoin, felbamate, oxcarbazepine, phenytoin, topiramate  modafinil  pioglitazone  rifabutin  rifampin  rifapentine  some medicines to treat HIV infection like atazanavir, efavirenz, indinavir, lopinavir, nelfinavir, tipranavir, ritonavir  St. John's wort  warfarin This list may not describe all possible interactions. Give your health care provider a list of all the medicines, herbs, non-prescription drugs, or dietary supplements you use. Also tell them if you smoke, drink alcohol, or use illegal drugs. Some items may interact with your medicine. What should I watch for while using this medicine? Visit your doctor or health care professional for regular check ups. See your doctor if you or your partner has sexual contact with others, becomes HIV positive, or gets a sexual transmitted disease. This product does not protect you against HIV infection (AIDS) or other sexually transmitted diseases. You can check the placement of the IUD yourself by reaching up to the top of your vagina with clean fingers to feel the threads. Do not pull on the threads. It is a good habit to check placement after each menstrual period. Call your doctor right away if you feel more of the IUD than just the threads or if you cannot feel the threads at   all. The IUD may come out by itself. You may become pregnant if the device comes out. If you notice that the IUD has come out use a backup birth control method like condoms and call your health care provider. Using tampons will not change the position of the  IUD and are okay to use during your period. This IUD can be safely scanned with magnetic resonance imaging (MRI) only under specific conditions. Before you have an MRI, tell your healthcare provider that you have an IUD in place, and which type of IUD you have in place. What side effects may I notice from receiving this medicine? Side effects that you should report to your doctor or health care professional as soon as possible:  allergic reactions like skin rash, itching or hives, swelling of the face, lips, or tongue  fever, flu-like symptoms  genital sores  high blood pressure  no menstrual period for 6 weeks during use  pain, swelling, warmth in the leg  pelvic pain or tenderness  severe or sudden headache  signs of pregnancy  stomach cramping  sudden shortness of breath  trouble with balance, talking, or walking  unusual vaginal bleeding, discharge  yellowing of the eyes or skin Side effects that usually do not require medical attention (report to your doctor or health care professional if they continue or are bothersome):  acne  breast pain  change in sex drive or performance  changes in weight  cramping, dizziness, or faintness while the device is being inserted  headache  irregular menstrual bleeding within first 3 to 6 months of use  nausea This list may not describe all possible side effects. Call your doctor for medical advice about side effects. You may report side effects to FDA at 1-800-FDA-1088. Where should I keep my medicine? This does not apply. NOTE: This sheet is a summary. It may not cover all possible information. If you have questions about this medicine, talk to your doctor, pharmacist, or health care provider.  2020 Elsevier/Gold Standard (2018-09-28 13:22:01)  

## 2020-03-12 ENCOUNTER — Ambulatory Visit: Payer: Medicaid Other

## 2020-04-09 ENCOUNTER — Ambulatory Visit: Payer: Medicaid Other | Admitting: Nurse Practitioner

## 2020-05-21 ENCOUNTER — Other Ambulatory Visit: Payer: Self-pay

## 2020-05-21 ENCOUNTER — Ambulatory Visit (INDEPENDENT_AMBULATORY_CARE_PROVIDER_SITE_OTHER): Payer: Self-pay | Admitting: Nurse Practitioner

## 2020-05-21 ENCOUNTER — Encounter: Payer: Self-pay | Admitting: Nurse Practitioner

## 2020-05-21 VITALS — BP 91/57 | HR 63 | Temp 98.1°F | Ht 63.5 in | Wt 115.2 lb

## 2020-05-21 DIAGNOSIS — R109 Unspecified abdominal pain: Secondary | ICD-10-CM

## 2020-05-21 DIAGNOSIS — Z3202 Encounter for pregnancy test, result negative: Secondary | ICD-10-CM

## 2020-05-21 LAB — POCT URINALYSIS DIPSTICK
Bilirubin, UA: NEGATIVE
Glucose, UA: NEGATIVE
Ketones, UA: NEGATIVE
Leukocytes, UA: NEGATIVE
Nitrite, UA: NEGATIVE
Protein, UA: NEGATIVE
Spec Grav, UA: >=1.03 — AB (ref 1.010–1.025)
Urobilinogen, UA: 0.2 E.U./dL
pH, UA: 6 (ref 5.0–8.0)

## 2020-05-21 LAB — POCT URINE PREGNANCY: Preg Test, Ur: NEGATIVE

## 2020-05-21 MED ORDER — FLUTICASONE PROPIONATE 50 MCG/ACT NA SUSP
2.0000 | Freq: Every day | NASAL | 6 refills | Status: DC
Start: 2020-05-21 — End: 2021-07-06

## 2020-05-21 MED ORDER — ESOMEPRAZOLE MAGNESIUM 20 MG PO CPDR: 20.0000 mg | DELAYED_RELEASE_CAPSULE | Freq: Every day | ORAL | 11 refills | Status: DC

## 2020-05-21 NOTE — Patient Instructions (Addendum)

## 2020-05-21 NOTE — Progress Notes (Signed)
[facility] Patient [facility] [address] [place], [place]  [address] Phone:  [phone]   Fax:  [phone]   Acute Office Visit  Subjective:    Patient ID: Brianna Haley, female    DOB: January 06, 1991, 29 y.o.   MRN: [id number]  Chief Complaint  Patient presents with   Abdominal Pain    stomach, nausea  vomitting     HPI Patient is in today for abdominal pain.  has a past medical history of Abdominal pain, Body aches, Chest discomfort, Constipation, Cough, Fatigue, Feeling of sadness, Fever, GERD (gastroesophageal reflux disease), Helicobacter pylori antibody positive, History of spontaneous abortion, Language barrier, cultural differences, Malaise, Maternal varicella, non-immune, RBBB, Sinusitis chronic, frontal, and SOB (shortness of breath).   Abdominal Pain Patient complains of abdominal pain. She denies any stomach pain but is just not feeling well in her "belly overall".The pain is described as cramping and sore, and is 8/10 in intensity. The patient is experiencing suprapubic pain without radiation. Onset was several months ago. Symptoms have been unchanged. She has a history of GERD.  She admits that the Nexium 20 mg she "finished it".  Her last dose was 1 month ago.Aggravating factors: activity and rest.  Alleviating factors: none. Associated symptoms: constipation and bloating and the inability to release gas. . The patient denies anorexia, arthralagias, belching, chills, diarrhea, dysuria, fever, flatus, frequency, headache, hematochezia, hematuria, melena, myalgias, sweats and vomiting.  Last menstrual cycle 04/27/20. PMS negative  Allergic Rhinitis Patient presents for evaluation of allergic symptoms.  Symptoms include headaches and postnasal drip and are present in a seasonal pattern.  Precipitants include unknown.  Treatment in the past has included intranasal antihistamines: Flonase.  Treatment currently includes None and is not effective in the patient's  opinion.   Past Medical History:  Diagnosis Date   Abdominal pain    Body aches    Chest discomfort    Constipation    Cough    Fatigue    Feeling of sadness    Fever    GERD (gastroesophageal reflux disease)    Helicobacter pylori antibody positive    History of spontaneous abortion    Language barrier, cultural differences    Malaise    Maternal varicella, non-immune    RBBB    Sinusitis chronic, frontal    SOB (shortness of breath)     History reviewed. No pertinent surgical history.  History reviewed. No pertinent family history.  Social History   Socioeconomic History   Marital status: Married    Spouse name: Not on file   Number of children: Not on file   Years of education: Not on file   Highest education level: Not on file  Occupational History   Not on file  Tobacco Use   Smoking status: Never Smoker   Smokeless tobacco: Never Used  Substance and Sexual Activity   Alcohol use: No   Drug use: No   Sexual activity: Yes    Birth control/protection: None  Other Topics Concern   Not on file  Social History Narrative   Not on file   Social Determinants of Health   Financial Resource Strain:    Difficulty of Paying Living Expenses:   Food Insecurity:    Worried About Programme researcher, broadcasting/film/video in the Last Year:    Barista in the Last Year:   Transportation Needs:    Freight forwarder (Medical):    Lack of Transportation (Non-Medical):   Physical  Activity:    Days of Exercise per Week:    Minutes of Exercise per Session:   Stress:    Feeling of Stress :   Social Connections:    Frequency of Communication with Friends and Family:    Frequency of Social Gatherings with Friends and Family:    Attends Religious Services:    Active Member of Clubs or Organizations:    Attends Music therapist:    Marital Status:   Intimate Partner Violence:    Fear of Current or Ex-Partner:     Emotionally Abused:    Physically Abused:    Sexually Abused:     Outpatient Medications Prior to Visit  Medication Sig Dispense Refill   esomeprazole (NEXIUM) 20 MG capsule Take 1 capsule (20 mg total) by mouth daily. (Patient not taking: Reported on 05/21/2020) 30 capsule 11   No facility-administered medications prior to visit.    No Known Allergies  Review of Systems  HENT: Positive for postnasal drip and sore throat.   Gastrointestinal: Positive for abdominal distention, constipation and nausea.       Bloating  Last bowel movement 3 days ago        Objective:    Physical Exam Constitutional:      General: She is not in acute distress.    Appearance: She is normal weight. She is not ill-appearing or toxic-appearing.  HENT:     Head: Normocephalic and atraumatic.  Cardiovascular:     Rate and Rhythm: Normal rate and regular rhythm.     Heart sounds: Normal heart sounds.  Pulmonary:     Effort: Pulmonary effort is normal.     Breath sounds: Normal breath sounds.  Abdominal:     General: Abdomen is flat. A surgical scar is present. Bowel sounds are normal. There is no distension. There are no signs of injury.     Palpations: Abdomen is soft.     Tenderness: There is abdominal tenderness in the suprapubic area.  Skin:    General: Skin is warm and dry.  Neurological:     Mental Status: She is alert and oriented to person, place, and time.  Psychiatric:        Mood and Affect: Mood normal.        Behavior: Behavior normal.     BP (!) 91/57 (BP Location: Left Arm, Patient Position: Sitting)    Pulse 63    Temp 98.1 F (36.7 C) (Temporal)    Ht 5' 3.5" (1.613 m)    Wt 115 lb 3.2 oz (52.3 kg)    LMP 04/26/2020    SpO2 97%    BMI 20.09 kg/m  Wt Readings from Last 3 Encounters:  05/21/20 115 lb 3.2 oz (52.3 kg)  03/05/20 116 lb (52.6 kg)  01/11/20 113 lb (51.3 kg)    Health Maintenance Due  Topic Date Due   Hepatitis C Screening  Never done   COVID-19  Vaccine (1) Never done   TETANUS/TDAP  Never done    There are no preventive care reminders to display for this patient.   Lab Results  Component Value Date   TSH 1.070 01/11/2020   Lab Results  Component Value Date   WBC 4.3 01/11/2020   HGB 13.4 01/11/2020   HCT 40.1 01/11/2020   MCV 93 01/11/2020   PLT 293 01/11/2020   Lab Results  Component Value Date   NA 139 01/11/2020   K 4.2 01/11/2020   CO2 22 01/11/2020  GLUCOSE 80 01/11/2020   BUN 10 01/11/2020   CREATININE 0.61 01/11/2020   BILITOT 0.7 01/11/2020   ALKPHOS 60 01/11/2020   AST 12 01/11/2020   ALT 13 01/11/2020   PROT 7.6 01/11/2020   ALBUMIN 5.0 01/11/2020   CALCIUM 9.9 01/11/2020   ANIONGAP 9 08/16/2018   No results found for: CHOL No results found for: HDL No results found for: LDLCALC No results found for: TRIG No results found for: CHOLHDL No results found for: WEXH3Z     Assessment & Plan:   Problem List Items Addressed This Visit    None    Visit Diagnoses    Abdominal pain in female    -  Primary   Relevant Orders   POCT urine pregnancy (Completed)   POCT Urinalysis Dipstick (Completed)   US Abdomen Complete   DG Abd 2 Views       Meds ordered this encounter  Medications   fluticasone (FLONASE) 50 MCG/ACT nasal spray    Sig: Place 2 sprays into both nostrils daily.    Dispense:  16 g    Refill:  6    Order Specific Question:   Supervising Provider    Answer:   Quentin Angst [[id number]]   esomeprazole (NEXIUM) 20 MG capsule    Sig: Take 1 capsule (20 mg total) by mouth daily.    Dispense:  30 capsule    Refill:  11    Order Specific Question:   Supervising Provider    Answer:   Quentin Angst [[id number]]     Barbette Merino, NP

## 2020-06-06 ENCOUNTER — Ambulatory Visit (INDEPENDENT_AMBULATORY_CARE_PROVIDER_SITE_OTHER): Payer: Self-pay | Admitting: Obstetrics

## 2020-06-06 ENCOUNTER — Other Ambulatory Visit (HOSPITAL_COMMUNITY)
Admission: RE | Admit: 2020-06-06 | Discharge: 2020-06-06 | Disposition: A | Discharge status: Home / Self Care | Payer: Medicaid Other | Source: Ambulatory Visit | Attending: Obstetrics | Admitting: Obstetrics

## 2020-06-06 ENCOUNTER — Other Ambulatory Visit: Payer: Self-pay

## 2020-06-06 VITALS — BP 96/67 | HR 77 | Wt 114.0 lb

## 2020-06-06 DIAGNOSIS — R300 Dysuria: Secondary | ICD-10-CM

## 2020-06-06 DIAGNOSIS — B3731 Acute candidiasis of vulva and vagina: Secondary | ICD-10-CM

## 2020-06-06 DIAGNOSIS — Z01419 Encounter for gynecological examination (general) (routine) without abnormal findings: Secondary | ICD-10-CM | POA: Insufficient documentation

## 2020-06-06 DIAGNOSIS — B373 Candidiasis of vulva and vagina: Secondary | ICD-10-CM

## 2020-06-06 LAB — POCT URINALYSIS DIPSTICK
Bilirubin, UA: NEGATIVE
Glucose, UA: NEGATIVE
Ketones, UA: NEGATIVE
Leukocytes, UA: NEGATIVE
Nitrite, UA: NEGATIVE
Protein, UA: NEGATIVE
Spec Grav, UA: 1.015 (ref 1.010–1.025)
Urobilinogen, UA: 0.2 E.U./dL
pH, UA: 6 (ref 5.0–8.0)

## 2020-06-06 MED ORDER — FLUCONAZOLE 150 MG PO TABS: 150.0000 mg | ORAL_TABLET | Freq: Once | ORAL | 0 refills | Status: AC

## 2020-06-06 MED ORDER — SULFAMETHOXAZOLE-TRIMETHOPRIM 800-160 MG PO TABS: 1.0000 | ORAL_TABLET | Freq: Two times a day (BID) | ORAL | 0 refills | Status: DC

## 2020-06-06 NOTE — Progress Notes (Signed)
Subjective:        Brianna Haley is a 29 y.o. female here for a routine exam.  Current complaints: Burning with urination.    Personal health questionnaire:  Is patient Brianna Haley, have a family history of breast and/or ovarian cancer: no Is there a family history of uterine cancer diagnosed at age < 7, gastrointestinal cancer, urinary tract cancer, family member who is a Personnel officer syndrome-associated carrier: no Is the patient overweight and hypertensive, family history of diabetes, personal history of gestational diabetes, preeclampsia or PCOS: no Is patient over 11, have PCOS,  family history of premature CHD under age 19, diabetes, smoke, have hypertension or peripheral artery disease:  no At any time, has a partner hit, kicked or otherwise hurt or frightened you?: no Over the past 2 weeks, have you felt down, depressed or hopeless?: no Over the past 2 weeks, have you felt little interest or pleasure in doing things?:no   Gynecologic History No LMP recorded. (Menstrual status: IUD). Contraception: none Last Pap: 2019. Results were: normal Last mammogram: n/a. Results were: n/a  Obstetric History OB History  Gravida Para Term Preterm AB Living  1 1 1     1   SAB TAB Ectopic Multiple Live Births               # Outcome Date GA Lbr Len/2nd Weight Sex Delivery Anes PTL Lv  1 Term             Past Medical History:  Diagnosis Date  . Abdominal pain   . Body aches   . Chest discomfort   . Constipation   . Cough   . Fatigue   . Feeling of sadness   . Fever   . GERD (gastroesophageal reflux disease)   . Helicobacter pylori antibody positive   . History of spontaneous abortion   . Language barrier, cultural differences   . Malaise   . Maternal varicella, non-immune   . RBBB   . Sinusitis chronic, frontal   . SOB (shortness of breath)     No past surgical history on file.   Current Outpatient Medications:  .  cholecalciferol (VITAMIN D3) 25 MCG (1000 UNIT)  tablet, Take 1,000 Units by mouth daily., Disp: , Rfl:  .  esomeprazole (NEXIUM) 20 MG capsule, Take 1 capsule (20 mg total) by mouth daily., Disp: 30 capsule, Rfl: 11 .  fluconazole (DIFLUCAN) 150 MG tablet, Take 1 tablet (150 mg total) by mouth once for 1 dose., Disp: 1 tablet, Rfl: 0 .  fluticasone (FLONASE) 50 MCG/ACT nasal spray, Place 2 sprays into both nostrils daily., Disp: 16 g, Rfl: 6 .  sulfamethoxazole-trimethoprim (BACTRIM DS) 800-160 MG tablet, Take 1 tablet by mouth 2 (two) times daily., Disp: 14 tablet, Rfl: 0 No Known Allergies  Social History   Tobacco Use  . Smoking status: Never Smoker  . Smokeless tobacco: Never Used  Substance Use Topics  . Alcohol use: No    No family history on file.    Review of Systems  Constitutional: negative for fatigue and weight loss Respiratory: negative for cough and wheezing Cardiovascular: negative for chest pain, fatigue and palpitations Gastrointestinal: negative for abdominal pain and change in bowel habits Musculoskeletal:negative for myalgias Neurological: negative for gait problems and tremors Behavioral/Psych: negative for abusive relationship, depression Endocrine: negative for temperature intolerance    Genitourinary:negative for abnormal menstrual periods, genital lesions, hot flashes, sexual problems and vaginal discharge.  Positive for burning with urination Integument/breast: negative for  breast lump, breast tenderness, nipple discharge and skin lesion(s)    Objective:       BP 96/67   Pulse 77   Wt 114 lb (51.7 kg)   BMI 19.88 kg/m  General:   alert and no distress  Skin:   no rash or abnormalities  Lungs:   clear to auscultation bilaterally  Heart:   regular rate and rhythm, S1, S2 normal, no murmur, click, rub or gallop  Breasts:   normal without suspicious masses, skin or nipple changes or axillary nodes  Abdomen:  normal findings: no organomegaly, soft, non-tender and no hernia  Pelvis:  External  genitalia: normal general appearance Urinary system: urethral meatus normal and bladder without fullness, nontender Vaginal: normal without tenderness, induration or masses Cervix: normal appearance Adnexa: normal bimanual exam Uterus: anteverted and non-tender, normal size   Lab Review Urine pregnancy test Labs reviewed yes Radiologic studies reviewed no  50% of 25 min visit spent on counseling and coordination of care.   Assessment:     1. Encounter for routine gynecological examination with Papanicolaou smear of cervix Rx: - Cytology - PAP( [place])  2. Dysuria Rx: - sulfamethoxazole-trimethoprim (BACTRIM DS) 800-160 MG tablet; Take 1 tablet by mouth 2 (two) times daily.  Dispense: 14 tablet; Refill: 0 - POCT urinalysis dipstick - Urine Culture  3. Candida vaginitis Rx: - fluconazole (DIFLUCAN) 150 MG tablet; Take 1 tablet (150 mg total) by mouth once for 1 dose.  Dispense: 1 tablet; Refill: 0    Plan:    Education reviewed: calcium supplements, depression evaluation, low fat, low cholesterol diet, safe sex/STD prevention, self breast exams and weight bearing exercise. Contraception: none. Follow up in: 1 year.   Meds ordered this encounter  Medications  . sulfamethoxazole-trimethoprim (BACTRIM DS) 800-160 MG tablet    Sig: Take 1 tablet by mouth 2 (two) times daily.    Dispense:  14 tablet    Refill:  0  . fluconazole (DIFLUCAN) 150 MG tablet    Sig: Take 1 tablet (150 mg total) by mouth once for 1 dose.    Dispense:  1 tablet    Refill:  0   Orders Placed This Encounter  Procedures  . Urine Culture  . POCT urinalysis dipstick    Brock Bad, MD 06/06/2020 11:53 AM

## 2020-06-07 LAB — CYTOLOGY - PAP: Diagnosis: NEGATIVE

## 2020-06-08 LAB — URINE CULTURE: Organism ID, Bacteria: NO GROWTH

## 2020-11-27 ENCOUNTER — Other Ambulatory Visit: Payer: Self-pay

## 2020-11-27 ENCOUNTER — Encounter: Payer: Self-pay | Admitting: Obstetrics

## 2020-11-27 ENCOUNTER — Ambulatory Visit (INDEPENDENT_AMBULATORY_CARE_PROVIDER_SITE_OTHER): Payer: Self-pay

## 2020-11-27 DIAGNOSIS — O219 Vomiting of pregnancy, unspecified: Secondary | ICD-10-CM

## 2020-11-27 DIAGNOSIS — N912 Amenorrhea, unspecified: Secondary | ICD-10-CM

## 2020-11-27 DIAGNOSIS — Z3490 Encounter for supervision of normal pregnancy, unspecified, unspecified trimester: Secondary | ICD-10-CM

## 2020-11-27 LAB — POCT URINE PREGNANCY: Preg Test, Ur: POSITIVE — AB

## 2020-11-27 MED ORDER — PROMETHAZINE HCL 25 MG PO TABS: 25.0000 mg | ORAL_TABLET | Freq: Four times a day (QID) | ORAL | 0 refills | Status: DC | PRN

## 2020-11-27 MED ORDER — BLOOD PRESSURE MONITOR KIT: 1.0000 | PACK | 0 refills | Status: DC

## 2020-11-27 NOTE — Progress Notes (Signed)
Patient was assessed and managed by nursing staff during this encounter. I have reviewed the chart and agree with the documentation and plan. I have also made any necessary editorial changes.  Warden Fillers, MD 11/27/2020 12:55 PM

## 2020-11-27 NOTE — Progress Notes (Signed)
Brianna Haley presents today for UPT. She complains of nausea and vomiting.  LMP: 10/13/2020    OBJECTIVE: Appears well, in no apparent distress.  OB History    Gravida  1   Para  1   Term  1   Preterm      AB      Living  1     SAB      IAB      Ectopic      Multiple      Live Births             Home UPT Result: Positive  In-Office UPT result: Positive  I have reviewed the patient's medical, obstetrical, social, and family histories, and medications.   ASSESSMENT: Positive pregnancy test  PLAN Prenatal care to be completed at: [facility] NOB intake completed  B/P Cuff Sent Today  PHQ-9 = 3 GAD -7 = 0 Pt given safe medication list  NOB Labs to be completed at NOB visit.  EDD: 07/20/21

## 2020-12-01 DIAGNOSIS — Z419 Encounter for procedure for purposes other than remedying health state, unspecified: Secondary | ICD-10-CM | POA: Diagnosis not present

## 2020-12-01 NOTE — L&D Delivery Note (Signed)
OB/GYN Faculty Practice Delivery Note  Brianna Haley is a 30 y.o. G3P1011 s/p VD at [redacted]w[redacted]d. She was admitted for labor.   ROM: 4h 56m with clear fluid GBS Status:  Negative/-- (07/25 1319) Maximum Maternal Temperature: 99.41F  Labor Progress: Initial SVE: 6/90/-2. She then progressed to complete.   Delivery Date/Time: 07/04/21, 1327 Delivery: Called to room and patient was complete and pushing. Head delivered right OA. No nuchal cord present. Shoulder and body delivered in usual fashion. Infant with spontaneous cry, placed on mother's abdomen, dried and stimulated. Cord clamped x 2 after 1-minute delay, and cut by provider. Cord blood drawn. Placenta delivered spontaneously with gentle cord traction. Fundus firm with massage and Pitocin. Labia, perineum, vagina, and cervix inspected inspected with superficial perineal abrasion that became hemostatic with pressure.  Baby Weight: pending  Placenta: Sent to L&D Complications: None Lacerations: None  EBL: 100 mL Analgesia: Epidural  Infant:  APGAR (1 MIN): 9 APGAR (5 MINS): 9 APGAR (10 MINS):     Leticia Penna, DO  OB Family Medicine Fellow, [facility] for [facility], [facility] 07/04/2021, 1:56 PM

## 2021-01-01 DIAGNOSIS — Z419 Encounter for procedure for purposes other than remedying health state, unspecified: Secondary | ICD-10-CM | POA: Diagnosis not present

## 2021-01-03 ENCOUNTER — Ambulatory Visit (INDEPENDENT_AMBULATORY_CARE_PROVIDER_SITE_OTHER): Payer: Medicaid Other | Admitting: Family Medicine

## 2021-01-03 ENCOUNTER — Other Ambulatory Visit (HOSPITAL_COMMUNITY)
Admission: RE | Admit: 2021-01-03 | Discharge: 2021-01-03 | Disposition: A | Discharge status: Home / Self Care | Payer: Medicaid Other | Source: Ambulatory Visit

## 2021-01-03 ENCOUNTER — Encounter: Payer: Self-pay | Admitting: Family Medicine

## 2021-01-03 ENCOUNTER — Other Ambulatory Visit: Payer: Self-pay

## 2021-01-03 VITALS — BP 98/61 | HR 96 | Wt 112.2 lb

## 2021-01-03 DIAGNOSIS — Z3A11 11 weeks gestation of pregnancy: Secondary | ICD-10-CM | POA: Diagnosis not present

## 2021-01-03 DIAGNOSIS — Z3481 Encounter for supervision of other normal pregnancy, first trimester: Secondary | ICD-10-CM

## 2021-01-03 DIAGNOSIS — Z23 Encounter for immunization: Secondary | ICD-10-CM | POA: Diagnosis not present

## 2021-01-03 DIAGNOSIS — Z3480 Encounter for supervision of other normal pregnancy, unspecified trimester: Secondary | ICD-10-CM | POA: Diagnosis not present

## 2021-01-03 NOTE — Progress Notes (Signed)
Subjective:   Brianna Haley is a 30 y.o. G2P1001 at 68w5dby LMP (regular prior to conceiving) being seen today for her first obstetrical visit.  Her obstetrical history is significant for none. Patient does intend to breast feed. Pregnancy history fully reviewed.  Patient reports no complaints.  [facility] Prenatal hx notable for velamentous cord insertion, uncomplicated delivery with 2nd degree laceration.   HISTORY: OB History  Gravida Para Term Preterm AB Living  '2 1 1 ' 0 0 1  SAB IAB Ectopic Multiple Live Births  0 0 0 0 1    # Outcome Date GA Lbr Len/2nd Weight Sex Delivery Anes PTL Lv  2 Current           1 Term 05/04/17    M Vag-Spont EPI  LIV   Last pap smear was  06/06/2020 and was normal Past Medical History:  Diagnosis Date  . Abdominal pain   . Body aches   . Chest discomfort   . Constipation   . Cough   . Fatigue   . Feeling of sadness   . Fever   . GERD (gastroesophageal reflux disease)   . Helicobacter pylori antibody positive   . History of spontaneous abortion   . Language barrier, cultural differences   . Malaise   . Maternal varicella, non-immune   . RBBB   . Sinusitis chronic, frontal   . SOB (shortness of breath)    History reviewed. No pertinent surgical history. Family History  Problem Relation Age of Onset  . Healthy Mother   . Healthy Father    Social History   Tobacco Use  . Smoking status: Never Smoker  . Smokeless tobacco: Never Used  Vaping Use  . Vaping Use: Never used  Substance Use Topics  . Alcohol use: No  . Drug use: No   No Known Allergies Current Outpatient Medications on File Prior to Visit  Medication Sig Dispense Refill  . Blood Pressure Monitor KIT 1 Device by Does not apply route once a week. To be monitored Regularly at home. 1 kit 0  . cholecalciferol (VITAMIN D3) 25 MCG (1000 UNIT) tablet Take 1,000 Units by mouth daily.    .Marland Kitchenesomeprazole (NEXIUM) 20 MG  capsule Take 1 capsule (20 mg total) by mouth daily. 30 capsule 11  . fluticasone (FLONASE) 50 MCG/ACT nasal spray Place 2 sprays into both nostrils daily. 16 g 6  . promethazine (PHENERGAN) 25 MG tablet Take 1 tablet (25 mg total) by mouth every 6 (six) hours as needed for nausea or vomiting. (Patient not taking: Reported on 01/03/2021) 30 tablet 0  . sulfamethoxazole-trimethoprim (BACTRIM DS) 800-160 MG tablet Take 1 tablet by mouth 2 (two) times daily. (Patient not taking: No sig reported) 14 tablet 0   No current facility-administered medications on file prior to visit.     Exam   Vitals:   01/03/21 1023  BP: 98/61  Pulse: 96  Weight: 112 lb 3.2 oz (50.9 kg)   Fetal Heart Rate (bpm): 164  Uterus:     System: General: well-developed, well-nourished female in no acute distress   Breast:  normal appearance, no masses or tenderness   Skin: normal coloration and turgor, no rashes   Neurologic: oriented, normal, negative, normal mood   Extremities: normal strength, tone, and muscle mass, ROM of all joints is normal   HEENT PERRLA, extraocular movement intact and sclera clear, anicteric   Neck  supple and no masses   Respiratory:  no respiratory distress     Assessment:   Pregnancy: G2P1001 Patient Active Problem List   Diagnosis Date Noted  . Supervision of normal pregnancy, antepartum 11/27/2020  . Vitamin D insufficiency 01/12/2020  . Malaise and fatigue 07/22/2017  . Language barrier 12/04/2016  . History of spontaneous abortion 07/11/2015     Plan:  1. Supervision of normal pregnancy Initial labs drawn. Continue prenatal vitamins. Genetic Screening discussed, NIPS: ordered, DOES NOT WANT TO KNOW GENDER UNTIL DELIVERY. Ultrasound discussed; fetal anatomic survey: ordered. Accepts flu shot. Declines COVID. Problem list reviewed and updated. The nature of [place] with multiple MDs and other Advanced Practice Providers was explained  to patient; also emphasized that residents, students are part of our team. Routine obstetric precautions reviewed. Return in 4 weeks (on 01/31/2021) for [facility], ob visit.

## 2021-01-03 NOTE — Patient Instructions (Signed)
 Contraception Choices Contraception, also called birth control, refers to methods or devices that prevent pregnancy. Hormonal methods Contraceptive implant A contraceptive implant is a thin, plastic tube that contains a hormone that prevents pregnancy. It is different from an intrauterine device (IUD). It is inserted into the upper part of the arm by a health care provider. Implants can be effective for up to 3 years. Progestin-only injections Progestin-only injections are injections of progestin, a synthetic form of the hormone progesterone. They are given every 3 months by a health care provider. Birth control pills Birth control pills are pills that contain hormones that prevent pregnancy. They must be taken once a day, preferably at the same time each day. A prescription is needed to use this method of contraception. Birth control patch The birth control patch contains hormones that prevent pregnancy. It is placed on the skin and must be changed once a week for three weeks and removed on the fourth week. A prescription is needed to use this method of contraception. Vaginal ring A vaginal ring contains hormones that prevent pregnancy. It is placed in the vagina for three weeks and removed on the fourth week. After that, the process is repeated with a new ring. A prescription is needed to use this method of contraception. Emergency contraceptive Emergency contraceptives prevent pregnancy after unprotected sex. They come in pill form and can be taken up to 5 days after sex. They work best the sooner they are taken after having sex. Most emergency contraceptives are available without a prescription. This method should not be used as your only form of birth control.   Barrier methods Female condom A female condom is a thin sheath that is worn over the penis during sex. Condoms keep sperm from going inside a woman's body. They can be used with a sperm-killing substance (spermicide) to increase their  effectiveness. They should be thrown away after one use. Female condom A female condom is a soft, loose-fitting sheath that is put into the vagina before sex. The condom keeps sperm from going inside a woman's body. They should be thrown away after one use. Diaphragm A diaphragm is a soft, dome-shaped barrier. It is inserted into the vagina before sex, along with a spermicide. The diaphragm blocks sperm from entering the uterus, and the spermicide kills sperm. A diaphragm should be left in the vagina for 6-8 hours after sex and removed within 24 hours. A diaphragm is prescribed and fitted by a health care provider. A diaphragm should be replaced every 1-2 years, after giving birth, after gaining more than 15 lb (6.8 kg), and after pelvic surgery. Cervical cap A cervical cap is a round, soft latex or plastic cup that fits over the cervix. It is inserted into the vagina before sex, along with spermicide. It blocks sperm from entering the uterus. The cap should be left in place for 6-8 hours after sex and removed within 48 hours. A cervical cap must be prescribed and fitted by a health care provider. It should be replaced every 2 years. Sponge A sponge is a soft, circular piece of polyurethane foam with spermicide in it. The sponge helps block sperm from entering the uterus, and the spermicide kills sperm. To use it, you make it wet and then insert it into the vagina. It should be inserted before sex, left in for at least 6 hours after sex, and removed and thrown away within 30 hours. Spermicides Spermicides are chemicals that kill or block sperm from entering the   cervix and uterus. They can come as a cream, jelly, suppository, foam, or tablet. A spermicide should be inserted into the vagina with an applicator at least 10-15 minutes before sex to allow time for it to work. The process must be repeated every time you have sex. Spermicides do not require a prescription.   Intrauterine  contraception Intrauterine device (IUD) An IUD is a T-shaped device that is put in a woman's uterus. There are two types:  Hormone IUD.This type contains progestin, a synthetic form of the hormone progesterone. This type can stay in place for 3-5 years.  Copper IUD.This type is wrapped in copper wire. It can stay in place for 10 years. Permanent methods of contraception Female tubal ligation In this method, a woman's fallopian tubes are sealed, tied, or blocked during surgery to prevent eggs from traveling to the uterus. Hysteroscopic sterilization In this method, a small, flexible insert is placed into each fallopian tube. The inserts cause scar tissue to form in the fallopian tubes and block them, so sperm cannot reach an egg. The procedure takes about 3 months to be effective. Another form of birth control must be used during those 3 months. Female sterilization This is a procedure to tie off the tubes that carry sperm (vasectomy). After the procedure, the man can still ejaculate fluid (semen). Another form of birth control must be used for 3 months after the procedure. Natural planning methods Natural family planning In this method, a couple does not have sex on days when the woman could become pregnant. Calendar method In this method, the woman keeps track of the length of each menstrual cycle, identifies the days when pregnancy can happen, and does not have sex on those days. Ovulation method In this method, a couple avoids sex during ovulation. Symptothermal method This method involves not having sex during ovulation. The woman typically checks for ovulation by watching changes in her temperature and in the consistency of cervical mucus. Post-ovulation method In this method, a couple waits to have sex until after ovulation. Where to find more information  Centers for Disease Control and Prevention: www.cdc.gov Summary  Contraception, also called birth control, refers to methods or  devices that prevent pregnancy.  Hormonal methods of contraception include implants, injections, pills, patches, vaginal rings, and emergency contraceptives.  Barrier methods of contraception can include female condoms, female condoms, diaphragms, cervical caps, sponges, and spermicides.  There are two types of IUDs (intrauterine devices). An IUD can be put in a woman's uterus to prevent pregnancy for 3-5 years.  Permanent sterilization can be done through a procedure for males and females. Natural family planning methods involve nothaving sex on days when the woman could become pregnant. This information is not intended to replace advice given to you by your health care provider. Make sure you discuss any questions you have with your health care provider. Document Revised: 04/23/2020 Document Reviewed: 04/23/2020 Elsevier Patient Education  2021 [employer] Inc.   Breastfeeding  Choosing to breastfeed is one of the best decisions you can make for yourself and your baby. A change in hormones during pregnancy causes your breasts to make breast milk in your milk-producing glands. Hormones prevent breast milk from being released before your baby is born. They also prompt milk flow after birth. Once breastfeeding has begun, thoughts of your baby, as well as his or her sucking or crying, can stimulate the release of milk from your milk-producing glands. Benefits of breastfeeding Research shows that breastfeeding offers many health benefits   for infants and mothers. It also offers a cost-free and convenient way to feed your baby. For your baby  Your first milk (colostrum) helps your baby's digestive system to function better.  Special cells in your milk (antibodies) help your baby to fight off infections.  Breastfed babies are less likely to develop asthma, allergies, obesity, or type 2 diabetes. They are also at lower risk for sudden infant death syndrome (SIDS).  Nutrients in breast milk are better  able to meet your baby's needs compared to infant formula.  Breast milk improves your baby's brain development. For you  Breastfeeding helps to create a very special bond between you and your baby.  Breastfeeding is convenient. Breast milk costs nothing and is always available at the correct temperature.  Breastfeeding helps to burn calories. It helps you to lose the weight that you gained during pregnancy.  Breastfeeding makes your uterus return faster to its size before pregnancy. It also slows bleeding (lochia) after you give birth.  Breastfeeding helps to lower your risk of developing type 2 diabetes, osteoporosis, rheumatoid arthritis, cardiovascular disease, and breast, ovarian, uterine, and endometrial cancer later in life. Breastfeeding basics Starting breastfeeding  Find a comfortable place to sit or lie down, with your neck and back well-supported.  Place a pillow or a rolled-up blanket under your baby to bring him or her to the level of your breast (if you are seated). Nursing pillows are specially designed to help support your arms and your baby while you breastfeed.  Make sure that your baby's tummy (abdomen) is facing your abdomen.  Gently massage your breast. With your fingertips, massage from the outer edges of your breast inward toward the nipple. This encourages milk flow. If your milk flows slowly, you may need to continue this action during the feeding.  Support your breast with 4 fingers underneath and your thumb above your nipple (make the letter "C" with your hand). Make sure your fingers are well away from your nipple and your baby's mouth.  Stroke your baby's lips gently with your finger or nipple.  When your baby's mouth is open wide enough, quickly bring your baby to your breast, placing your entire nipple and as much of the areola as possible into your baby's mouth. The areola is the colored area around your nipple. ? More areola should be visible above your  baby's upper lip than below the lower lip. ? Your baby's lips should be opened and extended outward (flanged) to ensure an adequate, comfortable latch. ? Your baby's tongue should be between his or her lower gum and your breast.  Make sure that your baby's mouth is correctly positioned around your nipple (latched). Your baby's lips should create a seal on your breast and be turned out (everted).  It is common for your baby to suck about 2-3 minutes in order to start the flow of breast milk. Latching Teaching your baby how to latch onto your breast properly is very important. An improper latch can cause nipple pain, decreased milk supply, and poor weight gain in your baby. Also, if your baby is not latched onto your nipple properly, he or she may swallow some air during feeding. This can make your baby fussy. Burping your baby when you switch breasts during the feeding can help to get rid of the air. However, teaching your baby to latch on properly is still the best way to prevent fussiness from swallowing air while breastfeeding. Signs that your baby has successfully latched onto   your nipple  Silent tugging or silent sucking, without causing you pain. Infant's lips should be extended outward (flanged).  Swallowing heard between every 3-4 sucks once your milk has started to flow (after your let-down milk reflex occurs).  Muscle movement above and in front of his or her ears while sucking. Signs that your baby has not successfully latched onto your nipple  Sucking sounds or smacking sounds from your baby while breastfeeding.  Nipple pain. If you think your baby has not latched on correctly, slip your finger into the corner of your baby's mouth to break the suction and place it between your baby's gums. Attempt to start breastfeeding again. Signs of successful breastfeeding Signs from your baby  Your baby will gradually decrease the number of sucks or will completely stop sucking.  Your baby  will fall asleep.  Your baby's body will relax.  Your baby will retain a small amount of milk in his or her mouth.  Your baby will let go of your breast by himself or herself. Signs from you  Breasts that have increased in firmness, weight, and size 1-3 hours after feeding.  Breasts that are softer immediately after breastfeeding.  Increased milk volume, as well as a change in milk consistency and color by the fifth day of breastfeeding.  Nipples that are not sore, cracked, or bleeding. Signs that your baby is getting enough milk  Wetting at least 1-2 diapers during the first 24 hours after birth.  Wetting at least 5-6 diapers every 24 hours for the first week after birth. The urine should be clear or pale yellow by the age of 5 days.  Wetting 6-8 diapers every 24 hours as your baby continues to grow and develop.  At least 3 stools in a 24-hour period by the age of 5 days. The stool should be soft and yellow.  At least 3 stools in a 24-hour period by the age of 7 days. The stool should be seedy and yellow.  No loss of weight greater than 10% of birth weight during the first 3 days of life.  Average weight gain of 4-7 oz (113-198 g) per week after the age of 4 days.  Consistent daily weight gain by the age of 5 days, without weight loss after the age of 2 weeks. After a feeding, your baby may spit up a small amount of milk. This is normal. Breastfeeding frequency and duration Frequent feeding will help you make more milk and can prevent sore nipples and extremely full breasts (breast engorgement). Breastfeed when you feel the need to reduce the fullness of your breasts or when your baby shows signs of hunger. This is called "breastfeeding on demand." Signs that your baby is hungry include:  Increased alertness, activity, or restlessness.  Movement of the head from side to side.  Opening of the mouth when the corner of the mouth or cheek is stroked (rooting).  Increased  sucking sounds, smacking lips, cooing, sighing, or squeaking.  Hand-to-mouth movements and sucking on fingers or hands.  Fussing or crying. Avoid introducing a pacifier to your baby in the first 4-6 weeks after your baby is born. After this time, you may choose to use a pacifier. Research has shown that pacifier use during the first year of a baby's life decreases the risk of sudden infant death syndrome (SIDS). Allow your baby to feed on each breast as long as he or she wants. When your baby unlatches or falls asleep while feeding from the   first breast, offer the second breast. Because newborns are often sleepy in the first few weeks of life, you may need to awaken your baby to get him or her to feed. Breastfeeding times will vary from baby to baby. However, the following rules can serve as a guide to help you make sure that your baby is properly fed:  Newborns (babies 4 weeks of age or younger) may breastfeed every 1-3 hours.  Newborns should not go without breastfeeding for longer than 3 hours during the day or 5 hours during the night.  You should breastfeed your baby a minimum of 8 times in a 24-hour period. Breast milk pumping Pumping and storing breast milk allows you to make sure that your baby is exclusively fed your breast milk, even at times when you are unable to breastfeed. This is especially important if you go back to work while you are still breastfeeding, or if you are not able to be present during feedings. Your lactation consultant can help you find a method of pumping that works best for you and give you guidelines about how long it is safe to store breast milk.      Caring for your breasts while you breastfeed Nipples can become dry, cracked, and sore while breastfeeding. The following recommendations can help keep your breasts moisturized and healthy:  Avoid using soap on your nipples.  Wear a supportive bra designed especially for nursing. Avoid wearing underwire-style  bras or extremely tight bras (sports bras).  Air-dry your nipples for 3-4 minutes after each feeding.  Use only cotton bra pads to absorb leaked breast milk. Leaking of breast milk between feedings is normal.  Use lanolin on your nipples after breastfeeding. Lanolin helps to maintain your skin's normal moisture barrier. Pure lanolin is not harmful (not toxic) to your baby. You may also hand express a few drops of breast milk and gently massage that milk into your nipples and allow the milk to air-dry. In the first few weeks after giving birth, some women experience breast engorgement. Engorgement can make your breasts feel heavy, warm, and tender to the touch. Engorgement peaks within 3-5 days after you give birth. The following recommendations can help to ease engorgement:  Completely empty your breasts while breastfeeding or pumping. You may want to start by applying warm, moist heat (in the shower or with warm, water-soaked hand towels) just before feeding or pumping. This increases circulation and helps the milk flow. If your baby does not completely empty your breasts while breastfeeding, pump any extra milk after he or she is finished.  Apply ice packs to your breasts immediately after breastfeeding or pumping, unless this is too uncomfortable for you. To do this: ? Put ice in a plastic bag. ? Place a towel between your skin and the bag. ? Leave the ice on for 20 minutes, 2-3 times a day.  Make sure that your baby is latched on and positioned properly while breastfeeding. If engorgement persists after 48 hours of following these recommendations, contact your health care provider or a lactation consultant. Overall health care recommendations while breastfeeding  Eat 3 healthy meals and 3 snacks every day. Well-nourished mothers who are breastfeeding need an additional 450-500 calories a day. You can meet this requirement by increasing the amount of a balanced diet that you eat.  Drink  enough water to keep your urine pale yellow or clear.  Rest often, relax, and continue to take your prenatal vitamins to prevent fatigue, stress, and low   vitamin and mineral levels in your body (nutrient deficiencies).  Do not use any products that contain nicotine or tobacco, such as cigarettes and e-cigarettes. Your baby may be harmed by chemicals from cigarettes that pass into breast milk and exposure to secondhand smoke. If you need help quitting, ask your health care provider.  Avoid alcohol.  Do not use illegal drugs or marijuana.  Talk with your health care provider before taking any medicines. These include over-the-counter and prescription medicines as well as vitamins and herbal supplements. Some medicines that may be harmful to your baby can pass through breast milk.  It is possible to become pregnant while breastfeeding. If birth control is desired, ask your health care provider about options that will be safe while breastfeeding your baby. Where to find more information: [employer]: www.llli.org Contact a health care provider if:  You feel like you want to stop breastfeeding or have become frustrated with breastfeeding.  Your nipples are cracked or bleeding.  Your breasts are red, tender, or warm.  You have: ? Painful breasts or nipples. ? A swollen area on either breast. ? A fever or chills. ? Nausea or vomiting. ? Drainage other than breast milk from your nipples.  Your breasts do not become full before feedings by the fifth day after you give birth.  You feel sad and depressed.  Your baby is: ? Too sleepy to eat well. ? Having trouble sleeping. ? More than 1 week old and wetting fewer than 6 diapers in a 24-hour period. ? Not gaining weight by 5 days of age.  Your baby has fewer than 3 stools in a 24-hour period.  Your baby's skin or the white parts of his or her eyes become yellow. Get help right away if:  Your baby is overly tired  (lethargic) and does not want to wake up and feed.  Your baby develops an unexplained fever. Summary  Breastfeeding offers many health benefits for infant and mothers.  Try to breastfeed your infant when he or she shows early signs of hunger.  Gently tickle or stroke your baby's lips with your finger or nipple to allow the baby to open his or her mouth. Bring the baby to your breast. Make sure that much of the areola is in your baby's mouth. Offer one side and burp the baby before you offer the other side.  Talk with your health care provider or lactation consultant if you have questions or you face problems as you breastfeed. This information is not intended to replace advice given to you by your health care provider. Make sure you discuss any questions you have with your health care provider. Document Revised: 02/11/2018 Document Reviewed: 12/19/2016 Elsevier Patient Education  2021 Elsevier Inc.  

## 2021-01-03 NOTE — Progress Notes (Signed)
Last pap 07/21-normal

## 2021-01-04 LAB — CBC/D/PLT+RPR+RH+ABO+RUB AB...
Antibody Screen: NEGATIVE
Basophils Absolute: 0.1 10*3/uL (ref 0.0–0.2)
Basos: 1 %
EOS (ABSOLUTE): 0.2 10*3/uL (ref 0.0–0.4)
Eos: 3 %
HCV Ab: 0.1 s/co ratio (ref 0.0–0.9)
HIV Screen 4th Generation wRfx: NONREACTIVE
Hematocrit: 34.6 % (ref 34.0–46.6)
Hemoglobin: 11.6 g/dL (ref 11.1–15.9)
Hepatitis B Surface Ag: NEGATIVE
Immature Grans (Abs): 0 10*3/uL (ref 0.0–0.1)
Immature Granulocytes: 0 %
Lymphocytes Absolute: 1.6 10*3/uL (ref 0.7–3.1)
Lymphs: 23 %
MCH: 31 pg (ref 26.6–33.0)
MCHC: 33.5 g/dL (ref 31.5–35.7)
MCV: 93 fL (ref 79–97)
Monocytes Absolute: 0.4 10*3/uL (ref 0.1–0.9)
Monocytes: 6 %
Neutrophils Absolute: 4.9 10*3/uL (ref 1.4–7.0)
Neutrophils: 67 %
Platelets: 269 10*3/uL (ref 150–450)
RBC: 3.74 x10E6/uL — ABNORMAL LOW (ref 3.77–5.28)
RDW: 12 % (ref 11.7–15.4)
RPR Ser Ql: NONREACTIVE
Rh Factor: POSITIVE
Rubella Antibodies, IGG: 24.8 index (ref >0.99)
WBC: 7.2 10*3/uL (ref 3.4–10.8)

## 2021-01-04 LAB — GC/CHLAMYDIA PROBE AMP (~~LOC~~) NOT AT ARMC
Chlamydia: NEGATIVE
Comment: NEGATIVE
Comment: NORMAL
Neisseria Gonorrhea: NEGATIVE

## 2021-01-04 LAB — HCV INTERPRETATION

## 2021-01-05 LAB — CULTURE, OB URINE

## 2021-01-05 LAB — URINE CULTURE, OB REFLEX

## 2021-01-14 ENCOUNTER — Encounter: Payer: Self-pay | Admitting: Obstetrics and Gynecology

## 2021-01-23 ENCOUNTER — Encounter: Payer: Self-pay | Admitting: Obstetrics and Gynecology

## 2021-01-29 DIAGNOSIS — Z419 Encounter for procedure for purposes other than remedying health state, unspecified: Secondary | ICD-10-CM | POA: Diagnosis not present

## 2021-01-31 ENCOUNTER — Other Ambulatory Visit: Payer: Self-pay

## 2021-01-31 ENCOUNTER — Ambulatory Visit (INDEPENDENT_AMBULATORY_CARE_PROVIDER_SITE_OTHER): Payer: Medicaid Other

## 2021-01-31 VITALS — BP 117/65 | HR 94 | Wt 116.0 lb

## 2021-01-31 DIAGNOSIS — Z3A15 15 weeks gestation of pregnancy: Secondary | ICD-10-CM

## 2021-01-31 DIAGNOSIS — Z789 Other specified health status: Secondary | ICD-10-CM

## 2021-01-31 DIAGNOSIS — Z3480 Encounter for supervision of other normal pregnancy, unspecified trimester: Secondary | ICD-10-CM | POA: Diagnosis not present

## 2021-01-31 DIAGNOSIS — K5900 Constipation, unspecified: Secondary | ICD-10-CM

## 2021-01-31 NOTE — Progress Notes (Signed)
+   Fetal movement. No complaints. Pt needs RF on prenatal vitamins.

## 2021-02-01 NOTE — Progress Notes (Signed)
   LOW-RISK PREGNANCY OFFICE VISIT  Patient name: Brianna Haley MRN [id number]  Date of birth: Jun 12, 1991 Chief Complaint:   Routine Prenatal Visit  Subjective:   Brianna Haley is a 30 y.o. G51P1001 female at [redacted]w[redacted]d with an Estimated Date of Delivery: 07/20/21 being seen today for ongoing management of a low-risk pregnancy aeb has History of spontaneous abortion; Language barrier; Malaise and fatigue; Vitamin D insufficiency; and Supervision of normal pregnancy, antepartum on their problem list.  Patient presents today without complaint. She reports feeling "okay, pretty normal."  However, she reports some constipation and hasn't had a bowel movement for 2 days and that it was hard to pass.  She reports drinking ~2-3 bottles of water daily and admits that her diet is not high in fiber. She denies urinary issues.   Patient endorses fetal movement. She denies vaginal concerns including abnormal discharge, leaking of fluid, and bleeding. No abdominal cramping or contractions.  Contractions: Not present. Vag. Bleeding: None.  Movement: Present.  Reviewed past medical,surgical, social, obstetrical and family history as well as problem list, medications and allergies.  Objective   Vitals:   01/31/21 1024  BP: 117/65  Pulse: 94  Weight: 116 lb (52.6 kg)  Body mass index is 20.23 kg/m.  Total Weight Gain:Not found.         Physical Examination:   General appearance: Well appearing, and in no distress  Mental status: Alert, oriented to person, place, and time  Skin: Warm & dry  Cardiovascular: Normal heart rate noted  Respiratory: Normal respiratory effort, no distress  Abdomen: Soft, gravid, nontender, Fundal height not formally assessed.   Pelvic: Cervical exam deferred           Extremities: Edema: None  Fetal Status: Fetal Heart Rate (bpm): 156  Movement: Present   No results found for this or any previous visit (from the past 24 hour(s)).  Assessment & Plan:  Low-risk pregnancy of a 30  y.o., G2P1001 at [redacted]w[redacted]d with an Estimated Date of Delivery: 07/20/21   1. Supervision of other normal pregnancy, antepartum -Anticipatory guidance for upcoming appts. -Patient to next appt in 5 weeks for an in-person visit. -Scheduled for anatomy US on March 28. -Patient requests and given sealed envelope with gender for reveal party!  2. [redacted] weeks gestation of pregnancy -Doing well. -Addressed constipation. -AFP ordered and collected today.  3. Language barrier -Interpretations completed with assistance of [employer] interpreter # [phone].  4. Constipation, unspecified constipation type -Discussed increasing fiber in diet. -Encouraged to increase daily water intake. -Instructed to notify staff if constipation continues despite interventions. -Will provide information on fiber content in nutrition.     Meds: No orders of the defined types were placed in this encounter.  Labs/procedures today:  Lab Orders     AFP, Serum, Open Spina Bifida   Reviewed: Preterm labor symptoms and general obstetric precautions including but not limited to vaginal bleeding, contractions, leaking of fluid and fetal movement were reviewed in detail with the patient.  All questions were answered.  Follow-up: No follow-ups on file.  Orders Placed This Encounter  Procedures  . AFP, Serum, Open Spina Bifida   Cherre Robins MSN, CNM 01/31/2021

## 2021-02-01 NOTE — Patient Instructions (Signed)
Fiber Content in Foods Fiber is a substance that is found in plant foods, such as fruits, vegetables, whole grains, nuts, seeds, and beans. As part of your treatment and recovery plan, your health care provider may recommend that you eat foods that have specific amounts of dietary fiber. Some conditions may require a high-fiber diet while others may require a low-fiber diet. This sheet gives you information about the dietary fiber content of some common foods. Your health care provider will tell you how much fiber you need in your diet. If you have problems or questions, contact your health care provider or dietitian. What foods are high in fiber? Fruits  Blackberries or raspberries (fresh) --  cup (75 g) has 4 g of fiber.  Pear (fresh) -- 1 medium (180 g) has 5.5 g of fiber.  Prunes (dried) -- 6 to 8 pieces (57-76 g) has 5 g of fiber.  Apple with skin -- 1 medium (182 g) has 4.8 g of fiber.  Guava -- 1 cup (128 g) has 8.9 g of fiber. Vegetables  Peas (frozen) --  cup (80 g) has 4.4 g of fiber.  Potato with skin (baked) -- 1 medium (173 g) has 4.4 g of fiber.  Pumpkin (canned) --  cup (122 g) has 5 g of fiber.  Brussels sprouts (cooked) --  cup (78 g) has 4 g of fiber.  Sweet potato --  cup mashed (124 g) has 4 g of fiber.  Winter squash -- 1 cup cooked (205 g) has 5.7 g of fiber. Grains  Bran cereal --  cup (31 g) has 8.6 g of fiber.  Bulgur (cooked) --  cup (70 g) has 4 g of fiber.  Quinoa (cooked) -- 1 cup (185 g) has 5.2 g of fiber.  Popcorn -- 3 cups (375 g) popped has 5.8 g of fiber.  Spaghetti, whole wheat -- 1 cup (140 g) has 6 g of fiber. Meats and other proteins  Pinto beans (cooked) --  cup (90 g) has 7.7 g of fiber.  Lentils (cooked) --  cup (90 g) has 7.8 g of fiber.  Kidney beans (canned) --  cup (92.5 g) has 5.7 g of fiber.  Soybeans (canned, frozen, or fresh) --  cup (92.5 g) has 5.2 g of fiber.  Baked beans, plain or vegetarian (canned) --   cup (130 g) has 5.2 g of fiber.  Garbanzo beans or chickpeas (canned) --  cup (90 g) has 6.6 g of fiber.  Black beans (cooked) --  cup (86 g) has 7.5 g of fiber.  White beans or navy beans (cooked) --  cup (91 g) has 9.3 g of fiber. The items listed above may not be a complete list of foods with high fiber. Actual amounts of fiber may be different depending on processing. Contact a dietitian for more information.   What foods are moderate in fiber? Fruits  Banana -- 1 medium (126 g) has 3.2 g of fiber.  Melon -- 1 cup (155 g) has 1.4 g of fiber.  Orange -- 1 small (154 g) has 3.7 g of fiber.  Raisins --  cup (40 g) has 1.8 g of fiber.  Applesauce, sweetened --  cup (125 g) has 1.5 g of fiber.  Blueberries (fresh) --  cup (75 g) has 1.8 g of fiber.  Strawberries (fresh, sliced) -- 1 cup (150 g) has 3 g of fiber.  Cherries -- 1 cup (140 g) has 2.9 g of fiber. Vegetables    Broccoli (cooked) --  cup (77.5 g) has 2.1 g of fiber.  Carrots (cooked) --  cup (77.5 g) has 2.2 g of fiber.  Corn (canned or frozen) --  cup (82.5 g) has 2.1 g of fiber.  Potatoes, mashed --  cup (105 g) has 1.6 g of fiber.  Tomato -- 1 medium (62 g) has 1.5 g of fiber.  Green beans (canned) --  cup (83 g) has 2 g of fiber.  Squash, winter --  cup (58 g) has 1 g of fiber.  Sweet potato, baked -- 1 medium (150 g) has 3 g of fiber.  Cauliflower (cooked) -- 1/2 cup (90 g) has 2.3 g of fiber. Grains  Long-grain brown rice (cooked) -- 1 cup (196 g) has 3.5 g of fiber.  Bagel, plain -- one 4-inch (10 cm) bagel has 2 g of fiber.  Instant oatmeal --  cup (120 g) has about 2 g of fiber.  Macaroni noodles, enriched (cooked) -- 1 cup (140 g) has 2.5 g of fiber.  Multigrain cereal --  cup (15 g) has about 2-4 g of fiber.  Whole-wheat bread -- 1 slice (26 g) has 2 g of fiber.  Whole-wheat spaghetti noodles --  cup (70 g) has 3.2 g of fiber.  Corn tortilla -- one 6-inch (15 cm) tortilla  has 1.5 g of fiber. Meats and other proteins  Almonds --  cup or 1 oz (28 g) has 3.5 g of fiber.  Sunflower seeds in shell --  cup or  oz (11.5 g) has 1.1 g of fiber.  Vegetable or soy patty -- 1 patty (70 g) has 3.4 g of fiber.  Walnuts --  cup or 1 oz (30 g) has 2 g of fiber.  Flax seed -- 1 Tbsp (7 g) has 2.8 g of fiber. The items listed above may not be a complete list of foods that have moderate amounts of fiber. Actual amounts of fiber may be different depending on processing. Contact a dietitian for more information.   What foods are low in fiber? Low-fiber foods contain less than 1 g of fiber per serving. They include: Fruits  Fruit juice --  cup or 4 fl oz (118 mL) has 0.5 g of fiber. Vegetables  Lettuce -- 1 cup (35 g) has 0.5 g of fiber.  Cucumber (slices) --  cup (60 g) has 0.3 g of fiber.  Celery -- 1 stalk (40 g) has 0.1 g of fiber. Grains  Flour tortilla -- one 6-inch (15 cm) tortilla has 0.5 g of fiber.  White rice (cooked) --  cup (81.5 g) has 0.3 g of fiber. Meats and other proteins  Egg -- 1 large (50 g) has 0 g of fiber.  Meat, poultry, or fish -- 3 oz (85 g) has 0 g of fiber. Dairy  Milk -- 1 cup or 8 fl oz (237 mL) has 0 g of fiber.  Yogurt -- 1 cup (245 g) has 0 g of fiber. The items listed above may not be a complete list of foods that are low in fiber. Actual amounts of fiber may be different depending on processing. Contact a dietitian for more information.   Summary  Fiber is a substance that is found in plant foods, such as fruits, vegetables, whole grains, nuts, seeds, and beans.  As part of your treatment and recovery plan, your health care provider may recommend that you eat foods that have specific amounts of dietary fiber. This information is   not intended to replace advice given to you by your health care provider. Make sure you discuss any questions you have with your health care provider. Document Revised: 03/22/2020 Document  Reviewed: 03/22/2020 Elsevier Patient Education  2021 [employer] Inc.  

## 2021-02-02 LAB — AFP, SERUM, OPEN SPINA BIFIDA
AFP MoM: 0.96
AFP Value: 36.2 ng/mL
Gest. Age on Collection Date: 15.5 weeks
Maternal Age At EDD: 29.3 yr
OSBR Risk 1 IN: 10000
Test Results:: NEGATIVE
Weight: 116 [lb_av]

## 2021-02-05 ENCOUNTER — Other Ambulatory Visit: Payer: Self-pay | Admitting: Obstetrics

## 2021-02-05 DIAGNOSIS — Z3480 Encounter for supervision of other normal pregnancy, unspecified trimester: Secondary | ICD-10-CM

## 2021-02-05 MED ORDER — PRENATE MINI 18-0.6-0.4-350 MG PO CAPS: 1.0000 | ORAL_CAPSULE | Freq: Every day | ORAL | 4 refills | Status: DC

## 2021-02-25 ENCOUNTER — Other Ambulatory Visit: Payer: Self-pay

## 2021-02-25 ENCOUNTER — Ambulatory Visit: Payer: Medicaid Other | Attending: Family Medicine

## 2021-02-25 ENCOUNTER — Ambulatory Visit: Payer: Medicaid Other | Admitting: *Deleted

## 2021-02-25 ENCOUNTER — Encounter: Payer: Self-pay | Admitting: *Deleted

## 2021-02-25 VITALS — BP 114/58 | HR 111

## 2021-02-25 DIAGNOSIS — R6252 Short stature (child): Secondary | ICD-10-CM

## 2021-02-25 DIAGNOSIS — Z3480 Encounter for supervision of other normal pregnancy, unspecified trimester: Secondary | ICD-10-CM | POA: Insufficient documentation

## 2021-02-26 ENCOUNTER — Other Ambulatory Visit: Payer: Self-pay | Admitting: *Deleted

## 2021-02-26 DIAGNOSIS — Z362 Encounter for other antenatal screening follow-up: Secondary | ICD-10-CM

## 2021-02-27 ENCOUNTER — Other Ambulatory Visit: Payer: Self-pay

## 2021-02-27 ENCOUNTER — Ambulatory Visit (INDEPENDENT_AMBULATORY_CARE_PROVIDER_SITE_OTHER): Payer: Medicaid Other | Admitting: Obstetrics & Gynecology

## 2021-02-27 VITALS — BP 105/69 | HR 93 | Wt 119.0 lb

## 2021-02-27 DIAGNOSIS — Z3490 Encounter for supervision of normal pregnancy, unspecified, unspecified trimester: Secondary | ICD-10-CM

## 2021-02-27 DIAGNOSIS — Z789 Other specified health status: Secondary | ICD-10-CM

## 2021-02-27 LAB — POCT URINALYSIS DIPSTICK
Bilirubin, UA: NEGATIVE
Blood, UA: NEGATIVE
Glucose, UA: NEGATIVE
Ketones, UA: NEGATIVE
Leukocytes, UA: NEGATIVE
Nitrite, UA: NEGATIVE
Protein, UA: NEGATIVE
Spec Grav, UA: 1.015 (ref 1.010–1.025)
Urobilinogen, UA: 0.2 E.U./dL
pH, UA: 6.5 (ref 5.0–8.0)

## 2021-02-27 NOTE — Patient Instructions (Signed)

## 2021-02-27 NOTE — Progress Notes (Signed)
Pt states she is having pelvic pain and cramping x last night. Pt states that she is having burning pain in pelvic area, denies any urinary symptoms.

## 2021-02-27 NOTE — Progress Notes (Signed)
   PRENATAL VISIT NOTE  Subjective:  Brianna Haley is a 30 y.o. G2P1001 at [redacted]w[redacted]d being seen today for ongoing prenatal care.  She is currently monitored for the following issues for this high-risk pregnancy and has History of spontaneous abortion; Language barrier; Malaise and fatigue; Vitamin D insufficiency; and Supervision of normal pregnancy, antepartum on their problem list.  Patient reports no bleeding, no contractions and woke at 2 am RLQ pain, improving but still present.  Contractions: Irritability. Vag. Bleeding: None.  Movement: Present. Denies leaking of fluid.   The following portions of the patient's history were reviewed and updated as appropriate: allergies, current medications, past family history, past medical history, past social history, past surgical history and problem list.   Objective:   Vitals:   02/27/21 1045  BP: 105/69  Pulse: 93  Weight: 119 lb (54 kg)    Fetal Status: Fetal Heart Rate (bpm): 160 Fundal Height: 20 cm Movement: Present     General:  Alert, oriented and cooperative. Patient is in no acute distress.  Skin: Skin is warm and dry. No rash noted.   Cardiovascular: Normal heart rate noted  Respiratory: Normal respiratory effort, no problems with respiration noted  Abdomen: Soft, gravid, appropriate for gestational age.  Pain/Pressure: Present     Pelvic: Exam with chaperone recorded  Extremities: Normal range of motion.     Mental Status: Normal mood and affect. Normal behavior. Normal judgment and thought content.   Assessment and Plan:  Pregnancy: G2P1001 at [redacted]w[redacted]d 1. Encounter for supervision of normal pregnancy, antepartum, unspecified gravidity Discomforts of pregnancy, exam benign, precautions given - POCT urinalysis dipstick  2. Language barrier [name] interpreter AMD  Preterm labor symptoms and general obstetric precautions including but not limited to vaginal bleeding, contractions, leaking of fluid and fetal movement were reviewed  in detail with the patient. Please refer to After Visit Summary for other counseling recommendations.   Return in about 1 week (around 03/06/2021).  Future Appointments  Date Time Provider Department Center  03/07/2021 10:35 AM Rasch, Harolyn Rutherford, NP [facility] None  03/25/2021 11:00 AM [facility] NURSE [facility]  03/25/2021 11:15 AM [facility] US2 [facility]    Scheryl Darter, MD

## 2021-03-01 DIAGNOSIS — Z419 Encounter for procedure for purposes other than remedying health state, unspecified: Secondary | ICD-10-CM | POA: Diagnosis not present

## 2021-03-07 ENCOUNTER — Other Ambulatory Visit: Payer: Self-pay

## 2021-03-07 ENCOUNTER — Ambulatory Visit (INDEPENDENT_AMBULATORY_CARE_PROVIDER_SITE_OTHER): Payer: Medicaid Other | Admitting: Obstetrics and Gynecology

## 2021-03-07 ENCOUNTER — Encounter: Payer: Self-pay | Admitting: Obstetrics and Gynecology

## 2021-03-07 VITALS — BP 100/64 | HR 100 | Wt 120.0 lb

## 2021-03-07 DIAGNOSIS — Z3490 Encounter for supervision of normal pregnancy, unspecified, unspecified trimester: Secondary | ICD-10-CM

## 2021-03-07 NOTE — Progress Notes (Signed)
   PRENATAL VISIT NOTE  Subjective:  Brianna Haley is a 30 y.o. G2P1001 at [redacted]w[redacted]d being seen today for ongoing prenatal care.  She is currently monitored for the following issues for this low-risk pregnancy and has History of spontaneous abortion; Language barrier; Malaise and fatigue; Vitamin D insufficiency; and Supervision of normal pregnancy, antepartum on their problem list.  Patient reports no complaints.  Contractions: Not present. Vag. Bleeding: None.  Movement: Present. Denies leaking of fluid.   The following portions of the patient's history were reviewed and updated as appropriate: allergies, current medications, past family history, past medical history, past social history, past surgical history and problem list.   Objective:   Vitals:   03/07/21 1038  BP: 100/64  Pulse: 100  Weight: 120 lb (54.4 kg)    Fetal Status: Fetal Heart Rate (bpm): 164   Movement: Present     General:  Alert, oriented and cooperative. Patient is in no acute distress.  Skin: Skin is warm and dry. No rash noted.   Cardiovascular: Normal heart rate noted  Respiratory: Normal respiratory effort, no problems with respiration noted  Abdomen: Soft, gravid, appropriate for gestational age.  Pain/Pressure: Present     Pelvic: Cervical exam deferred        Extremities: Normal range of motion.  Edema: None  Mental Status: Normal mood and affect. Normal behavior. Normal judgment and thought content.   Assessment and Plan:  Pregnancy: G2P1001 at [redacted]w[redacted]d  1. Encounter for supervision of normal pregnancy, antepartum, unspecified gravidity  Interpretor used Reported some abdominal pain with [place]. No pain now. Reports it hurting when they were using the probe on her abdomen. Belly is soft, non-tender today.  AFP negative    Preterm labor symptoms and general obstetric precautions including but not limited to vaginal bleeding, contractions, leaking of fluid and fetal movement were reviewed in detail with the  patient. Please refer to After Visit Summary for other counseling recommendations.   Return in about 4 weeks (around 04/04/2021).  Future Appointments  Date Time Provider Department Center  03/25/2021 11:00 AM [facility] NURSE [facility]  03/25/2021 11:15 AM [facility]2 [facility]  04/08/2021 10:30 AM Constant, Gigi Gin, MD [facility] None    Venia Carbon, NP

## 2021-03-07 NOTE — Progress Notes (Signed)
+   Fetal movement. No complaints.  

## 2021-03-25 ENCOUNTER — Ambulatory Visit: Payer: Medicaid Other | Attending: Obstetrics

## 2021-03-25 ENCOUNTER — Ambulatory Visit: Payer: Medicaid Other | Admitting: *Deleted

## 2021-03-25 ENCOUNTER — Other Ambulatory Visit: Payer: Self-pay

## 2021-03-25 ENCOUNTER — Encounter: Payer: Self-pay | Admitting: *Deleted

## 2021-03-25 VITALS — BP 103/48 | HR 94

## 2021-03-25 DIAGNOSIS — Z3A23 23 weeks gestation of pregnancy: Secondary | ICD-10-CM

## 2021-03-25 DIAGNOSIS — Z362 Encounter for other antenatal screening follow-up: Secondary | ICD-10-CM | POA: Insufficient documentation

## 2021-03-25 DIAGNOSIS — O283 Abnormal ultrasonic finding on antenatal screening of mother: Secondary | ICD-10-CM | POA: Insufficient documentation

## 2021-03-25 DIAGNOSIS — O321XX0 Maternal care for breech presentation, not applicable or unspecified: Secondary | ICD-10-CM

## 2021-03-31 DIAGNOSIS — Z419 Encounter for procedure for purposes other than remedying health state, unspecified: Secondary | ICD-10-CM | POA: Diagnosis not present

## 2021-04-08 ENCOUNTER — Other Ambulatory Visit: Payer: Self-pay

## 2021-04-08 ENCOUNTER — Ambulatory Visit (INDEPENDENT_AMBULATORY_CARE_PROVIDER_SITE_OTHER): Payer: Medicaid Other | Admitting: Obstetrics and Gynecology

## 2021-04-08 ENCOUNTER — Encounter: Payer: Self-pay | Admitting: Obstetrics and Gynecology

## 2021-04-08 VITALS — BP 99/62 | HR 90 | Temp 98.3°F | Wt 128.0 lb

## 2021-04-08 DIAGNOSIS — Z3480 Encounter for supervision of other normal pregnancy, unspecified trimester: Secondary | ICD-10-CM

## 2021-04-08 DIAGNOSIS — Z789 Other specified health status: Secondary | ICD-10-CM

## 2021-04-08 NOTE — Progress Notes (Signed)
   PRENATAL VISIT NOTE  Subjective:  Brianna Haley is a 30 y.o. G3P1011 at [redacted]w[redacted]d being seen today for ongoing prenatal care.  She is currently monitored for the following issues for this low-risk pregnancy and has History of spontaneous abortion; Language barrier; Malaise and fatigue; Vitamin D insufficiency; and Supervision of normal pregnancy, antepartum on their problem list.  Patient reports no complaints.  Contractions: Not present. Vag. Bleeding: None.  Movement: Present. Denies leaking of fluid.   The following portions of the patient's history were reviewed and updated as appropriate: allergies, current medications, past family history, past medical history, past social history, past surgical history and problem list.   Objective:   Vitals:   04/08/21 1028  BP: 99/62  Pulse: 90  Temp: 98.3 F (36.8 C)  Weight: 128 lb (58.1 kg)    Fetal Status: Fetal Heart Rate (bpm): 152 Fundal Height: 24 cm Movement: Present     General:  Alert, oriented and cooperative. Patient is in no acute distress.  Skin: Skin is warm and dry. No rash noted.   Cardiovascular: Normal heart rate noted  Respiratory: Normal respiratory effort, no problems with respiration noted  Abdomen: Soft, gravid, appropriate for gestational age.  Pain/Pressure: Absent     Pelvic: Cervical exam deferred        Extremities: Normal range of motion.  Edema: None  Mental Status: Normal mood and affect. Normal behavior. Normal judgment and thought content.   Assessment and Plan:  Pregnancy: G3P1011 at [redacted]w[redacted]d 1. Supervision of other normal pregnancy, antepartum Patient is doing well Reassurance provided regarding occasional pelvic pressure Discussed back exercises as well Third trimester labs next visit with glucola  2. Language barrier [name] interpreter used  Preterm labor symptoms and general obstetric precautions including but not limited to vaginal bleeding, contractions, leaking of fluid and fetal movement  were reviewed in detail with the patient. Please refer to After Visit Summary for other counseling recommendations.   Return in about 3 weeks (around 04/29/2021) for in person, ROB, Low risk, 2 hr glucola next visit.  No future appointments.  Catalina Antigua, MD

## 2021-04-08 NOTE — Progress Notes (Signed)
ROB [redacted]w[redacted]d  CC: pt states she feel her abdomen is very heavy, and notes discomfort when walking.

## 2021-04-30 ENCOUNTER — Other Ambulatory Visit (HOSPITAL_COMMUNITY)
Admission: RE | Admit: 2021-04-30 | Discharge: 2021-04-30 | Disposition: A | Discharge status: Home / Self Care | Payer: Medicaid Other | Source: Ambulatory Visit | Attending: Obstetrics & Gynecology | Admitting: Obstetrics & Gynecology

## 2021-04-30 ENCOUNTER — Other Ambulatory Visit: Payer: Medicaid Other

## 2021-04-30 ENCOUNTER — Other Ambulatory Visit: Payer: Self-pay

## 2021-04-30 ENCOUNTER — Encounter: Payer: Self-pay | Admitting: Obstetrics & Gynecology

## 2021-04-30 ENCOUNTER — Ambulatory Visit (INDEPENDENT_AMBULATORY_CARE_PROVIDER_SITE_OTHER): Payer: Medicaid Other | Admitting: Obstetrics & Gynecology

## 2021-04-30 VITALS — BP 107/71 | HR 99 | Wt 131.0 lb

## 2021-04-30 DIAGNOSIS — N898 Other specified noninflammatory disorders of vagina: Secondary | ICD-10-CM

## 2021-04-30 DIAGNOSIS — Z23 Encounter for immunization: Secondary | ICD-10-CM | POA: Diagnosis not present

## 2021-04-30 DIAGNOSIS — Z3480 Encounter for supervision of other normal pregnancy, unspecified trimester: Secondary | ICD-10-CM

## 2021-04-30 DIAGNOSIS — Z789 Other specified health status: Secondary | ICD-10-CM

## 2021-04-30 NOTE — Patient Instructions (Signed)

## 2021-04-30 NOTE — Progress Notes (Signed)
   PRENATAL VISIT NOTE  Subjective:  Brianna Haley is a 30 y.o. G3P1011 at [redacted]w[redacted]d being seen today for ongoing prenatal care.  She is currently monitored for the following issues for this low-risk pregnancy and has History of spontaneous abortion; Language barrier; Malaise and fatigue; Vitamin D insufficiency; and Supervision of normal pregnancy, antepartum on their problem list.  Patient reports vaginal discharge for several months.  Contractions: Not present. Vag. Bleeding: None.  Movement: Present. Denies leaking of fluid.   The following portions of the patient's history were reviewed and updated as appropriate: allergies, current medications, past family history, past medical history, past social history, past surgical history and problem list.   Objective:   Vitals:   04/30/21 0859  BP: 107/71  Pulse: 99  Weight: 131 lb (59.4 kg)    Fetal Status: Fetal Heart Rate (bpm): 150   Movement: Present     General:  Alert, oriented and cooperative. Patient is in no acute distress.  Skin: Skin is warm and dry. No rash noted.   Cardiovascular: Normal heart rate noted  Respiratory: Normal respiratory effort, no problems with respiration noted  Abdomen: Soft, gravid, appropriate for gestational age.  Pain/Pressure: Present     Pelvic: Cervical exam deferred        Extremities: Normal range of motion.  Edema: None  Mental Status: Normal mood and affect. Normal behavior. Normal judgment and thought content.   Assessment and Plan:  Pregnancy: G3P1011 at [redacted]w[redacted]d 1. Supervision of other normal pregnancy, antepartum Will self swab for dx of vaginal discharge  2. Language barrier [name] interpreter via iPad  Preterm labor symptoms and general obstetric precautions including but not limited to vaginal bleeding, contractions, leaking of fluid and fetal movement were reviewed in detail with the patient. Please refer to After Visit Summary for other counseling recommendations.   Return in about  4 weeks (around 05/28/2021).  Future Appointments  Date Time Provider Department Center  04/30/2021 10:00 AM Adam Phenix, MD [facility] None    Scheryl Darter, MD

## 2021-04-30 NOTE — Progress Notes (Signed)
ROB [redacted]w[redacted]d PHQ-9 = 0   Tdap: handout given pt will let us know.  Used interpreter machine to explain T-Dap pt cannot read english.    CC: None

## 2021-05-01 DIAGNOSIS — Z419 Encounter for procedure for purposes other than remedying health state, unspecified: Secondary | ICD-10-CM | POA: Diagnosis not present

## 2021-05-01 LAB — CBC
Hematocrit: 32.9 % — ABNORMAL LOW (ref 34.0–46.6)
Hemoglobin: 10.7 g/dL — ABNORMAL LOW (ref 11.1–15.9)
MCH: 30.2 pg (ref 26.6–33.0)
MCHC: 32.5 g/dL (ref 31.5–35.7)
MCV: 93 fL (ref 79–97)
Platelets: 235 10*3/uL (ref 150–450)
RBC: 3.54 x10E6/uL — ABNORMAL LOW (ref 3.77–5.28)
RDW: 11.1 % — ABNORMAL LOW (ref 11.7–15.4)
WBC: 12.7 10*3/uL — ABNORMAL HIGH (ref 3.4–10.8)

## 2021-05-01 LAB — CERVICOVAGINAL ANCILLARY ONLY
Bacterial Vaginitis (gardnerella): NEGATIVE
Candida Glabrata: NEGATIVE
Candida Vaginitis: NEGATIVE
Chlamydia: NEGATIVE
Comment: NEGATIVE
Comment: NEGATIVE
Comment: NEGATIVE
Comment: NEGATIVE
Comment: NEGATIVE
Comment: NORMAL
Neisseria Gonorrhea: NEGATIVE
Trichomonas: NEGATIVE

## 2021-05-01 LAB — HIV ANTIBODY (ROUTINE TESTING W REFLEX): HIV Screen 4th Generation wRfx: NONREACTIVE

## 2021-05-01 LAB — GLUCOSE TOLERANCE, 2 HOURS W/ 1HR
Glucose, 1 hour: 140 mg/dL (ref 65–179)
Glucose, 2 hour: 100 mg/dL (ref 65–152)
Glucose, Fasting: 69 mg/dL (ref 65–91)

## 2021-05-01 LAB — RPR: RPR Ser Ql: NONREACTIVE

## 2021-05-08 ENCOUNTER — Other Ambulatory Visit: Payer: Self-pay

## 2021-05-08 ENCOUNTER — Ambulatory Visit (INDEPENDENT_AMBULATORY_CARE_PROVIDER_SITE_OTHER): Payer: Medicaid Other | Admitting: Women's Health

## 2021-05-08 VITALS — BP 103/65 | HR 96 | Wt 129.7 lb

## 2021-05-08 DIAGNOSIS — Z3480 Encounter for supervision of other normal pregnancy, unspecified trimester: Secondary | ICD-10-CM

## 2021-05-08 DIAGNOSIS — Z3A29 29 weeks gestation of pregnancy: Secondary | ICD-10-CM

## 2021-05-08 NOTE — Progress Notes (Signed)
Pt reports fetal movement, denies pain.  

## 2021-05-08 NOTE — Patient Instructions (Signed)
Maternity Assessment Unit (MAU)  The Maternity Assessment Unit (MAU) is located at the [facility] and [place] [facility]. The address is: [address], [place], [place], [place] [address]. Please see map below for additional directions.    The Maternity Assessment Unit is designed to help you during your pregnancy, and for up to 6 weeks after delivery, with any pregnancy- or postpartum-related emergencies, if you think you are in labor, or if your water has broken. For example, if you experience nausea and vomiting, vaginal bleeding, severe abdominal or pelvic pain, elevated blood pressure or other problems related to your pregnancy or postpartum time, please come to the Maternity Assessment Unit for assistance.        Preterm Labor The normal length of a pregnancy is 39-41 weeks. Preterm labor is when labor starts before 37 completed weeks of pregnancy. Babies who are born prematurely and survive may not be fully developed and may be at an increased risk for long-term problems such as cerebral palsy, developmental delays, and vision and hearing problems. Babies who are born too early may have problems soon after birth. Problems may include regulating blood sugar, body temperature, heart rate, and breathing rate. These babies often have trouble with feeding. The risk of having problems is highest for babies who are born before 44 weeks of pregnancy. What are the causes? The exact cause of this condition is not known. What increases the risk? You are more likely to have preterm labor if you have certain risk factors that relate to your medical history, problems with present and past pregnancies, and lifestyle factors. Medical history  You have abnormalities of the uterus, including a short cervix.  You have STIs (sexually transmitted infections), or other infections of the urinary tract and the vagina.  You have chronic illnesses, such as blood clotting problems,  diabetes, or high blood pressure.  You are overweight or underweight. Present and past pregnancies  You have had preterm labor before.  You are pregnant with twins or other multiples.  You have been diagnosed with a condition in which the placenta covers your cervix (placenta previa).  You waited less than 6 months between giving birth and becoming pregnant again.  Your unborn baby has some abnormalities.  You have vaginal bleeding during pregnancy.  You became pregnant through in vitro fertilization (IVF). Lifestyle and environmental factors  You use tobacco products.  You drink alcohol.  You use street drugs.  You have stress and no social support.  You experience domestic violence.  You are exposed to certain chemicals or environmental pollutants. Other factors  You are younger than age 110 or older than age 19. What are the signs or symptoms? Symptoms of this condition include:  Cramps similar to those that can happen during a menstrual period. The cramps may happen with diarrhea.  Pain in the abdomen or lower back.  Regular contractions that may feel like tightening of the abdomen.  A feeling of increased pressure in the pelvis.  Increased watery or bloody mucus discharge from the vagina.  Water breaking (ruptured amniotic sac). How is this diagnosed? This condition is diagnosed based on:  Your medical history and a physical exam.  A pelvic exam.  An ultrasound.  Monitoring your uterus for contractions.  Other tests, including: ? A swab of the cervix to check for a chemical called fetal fibronectin. ? Urine tests. How is this treated? Treatment for this condition depends on the length of your pregnancy, your  condition, and the health of your baby. Treatment may include:  Taking medicines, such as: ? Hormone medicines. These may be given early in pregnancy to help support the pregnancy. ? Medicines to stop contractions. ? Medicines to help mature  the baby's lungs. These may be prescribed if the risk of delivery is high. ? Medicines to prevent your baby from developing cerebral palsy.  Bed rest. If the labor happens before 34 weeks of pregnancy, you may need to stay in the hospital.  Delivery of the baby. Follow these instructions at home:  Do not use any products that contain nicotine or tobacco, such as cigarettes, e-cigarettes, and chewing tobacco. If you need help quitting, ask your health care provider.  Do not drink alcohol.  Take over-the-counter and prescription medicines only as told by your health care provider.  Rest as told by your health care provider.  Return to your normal activities as told by your health care provider. Ask your health care provider what activities are safe for you.  Keep all follow-up visits as told by your health care provider. This is important.   How is this prevented? To increase your chance of having a full-term pregnancy:  Do not use street drugs or medicines that have not been prescribed to you during your pregnancy.  Talk with your health care provider before taking any herbal supplements, even if you have been taking them regularly.  Make sure you gain a healthy amount of weight during your pregnancy.  Watch for infection. If you think that you might have an infection, get it checked right away. Symptoms of infection may include: ? Fever. ? Abnormal vaginal discharge or discharge that smells bad. ? Pain or burning with urination. ? Needing to urinate urgently. ? Frequently urinating or passing small amounts of urine frequently. ? Blood in your urine. ? Urine that smells bad or unusual.  Tell your health care provider if you have had preterm labor before. Contact a health care provider if:  You think you are going into preterm labor.  You have signs or symptoms of preterm labor.  You have symptoms of infection. Get help right away if:  You are having regular, painful  contractions every 5 minutes or less.  Your water breaks. Summary  Preterm labor is labor that starts before you reach 37 weeks of pregnancy.  Delivering your baby early increases your baby's risk of developing lifelong problems.  The exact cause of preterm labor is unknown. However, having an abnormal uterus, an STI (sexually transmitted infection), or vaginal bleeding during pregnancy increases your risk for preterm labor.  Keep all follow-up visits as told by your health care provider. This is important.  Contact a health care provider if you have signs or symptoms of preterm labor. This information is not intended to replace advice given to you by your health care provider. Make sure you discuss any questions you have with your health care provider. Document Revised: 12/20/2019 Document Reviewed: 12/20/2019 Elsevier Patient Education  2021 Elsevier Inc.       AREA PEDIATRIC/FAMILY PRACTICE PHYSICIANS  [facility] [address] [place], [place] [address] Phone - [phone]   Fax - [phone]  JACK AMOS [address] [place], [place]  [address] Phone - [phone]   Fax - [phone]  [facility] [address] [place], [place]  [address] Phone - [phone]   Fax - [phone]  [facility] [address] [place],  [place]  [address] Phone - [phone]   Fax - [phone]  [facility] [address] [place], [place]  [address] Phone - [phone]   Fax - [phone]  [facility] [address] [place], [place]  [address] Phone - [phone]   Fax - [phone]  [facility] [place] [address] [place], [place]  [address] Phone - [phone]   Fax - [phone]  [facility] AT [facility] [address] [place], [address] [place], [place]  [address] Phone - [phone]   Fax -  [phone]  [facility] AT [facility] [address] [place], [place]  [address] Phone - [phone]   Fax - [phone] [facility] AT [place] [address] [place], [place]  [address] Phone - [phone]   Fax - [phone]  [facility] [address] [place], [place]  [address] Phone - [phone]   Fax - [phone]  [facility] [address] [place], [place]  [address] Phone - [phone]   Fax - [phone]  [facility] [place] [address] [place], [place]  [address] Phone - [phone]   Fax - [phone]  [facility] [address], [address] [place], [place]  [address] Phone - [phone]  [facility] [address] [place], [place]  [address] Phone - [phone]   Fax - [phone]  [facility] [address] [place], [place]  [address] Phone - [phone]   Fax - [phone]  [facility] [address] [place], [place]  [address] Phone - [phone]   Fax - [phone]  [facility] [address] [place], [place]  [address] Phone - [phone]   Fax - [phone]   [facility] [address] [place], [place]  [address] Phone - [phone]   Fax - [phone]  [facility] [address] [place], [place]  [address] Phone - [phone]   Fax - [phone]  DAVID RUBIN [address] [place], [place]  [address] Phone - [phone]   Fax - [phone]  [facility] [address] [place], [place]  [address] Phone - [phone]   Fax - [phone]  [place] [facility] Alita Chyle [address] [place], [place]  [address] Phone - [phone]   Fax - [phone] Gerarda Fraction [address] [place], [place]  [address] Phone - [phone]   Fax -  [phone]  [facility] [address] [place], [place]  [address] Phone - [phone]   Fax - [phone]  [facility] - [place] [address] [place], [place]  [address] Phone - [phone]   Fax - [phone]

## 2021-05-08 NOTE — Progress Notes (Signed)
Subjective:  Brianna Haley is a 30 y.o. G3P1011 at [redacted]w[redacted]d being seen today for ongoing prenatal care.  She is currently monitored for the following issues for this low-risk pregnancy and has History of spontaneous abortion; Language barrier; Vitamin D insufficiency; and Supervision of normal pregnancy, antepartum on their problem list.  Patient reports no complaints.  Contractions: Not present. Vag. Bleeding: None.  Movement: Present. Denies leaking of fluid.   The following portions of the patient's history were reviewed and updated as appropriate: allergies, current medications, past family history, past medical history, past social history, past surgical history and problem list. Problem list updated.  Objective:   Vitals:   05/08/21 1055  BP: 103/65  Pulse: 96  Weight: 129 lb 11.2 oz (58.8 kg)    Fetal Status: Fetal Heart Rate (bpm): 153 Fundal Height: 31 cm Movement: Present     General:  Alert, oriented and cooperative. Patient is in no acute distress.  Skin: Skin is warm and dry. No rash noted.   Cardiovascular: Normal heart rate noted  Respiratory: Normal respiratory effort, no problems with respiration noted  Abdomen: Soft, gravid, appropriate for gestational age. Pain/Pressure: Absent     Pelvic: Vag. Bleeding: None     Cervical exam deferred        Extremities: Normal range of motion.  Edema: None  Mental Status: Normal mood and affect. Normal behavior. Normal judgment and thought content.   Urinalysis:      Assessment and Plan:  Pregnancy: G3P1011 at [redacted]w[redacted]d  1. Supervision of other normal pregnancy, antepartum -peds list given  2. [redacted] weeks gestation of pregnancy  Preterm labor symptoms and general obstetric precautions including but not limited to vaginal bleeding, contractions, leaking of fluid and fetal movement were reviewed in detail with the patient. I discussed the assessment and treatment plan with the patient. The patient was provided an opportunity to ask  questions and all were answered. The patient agreed with the plan and demonstrated an understanding of the instructions. The patient was advised to call back or seek an in-person office evaluation/go to MAU at [facility] for any urgent or concerning symptoms. Please refer to After Visit Summary for other counseling recommendations.  Return in about 2 weeks (around 05/22/2021) for in-person LOB/APP OK.   Maykayla Highley, Odie Sera, NP

## 2021-05-22 ENCOUNTER — Ambulatory Visit (INDEPENDENT_AMBULATORY_CARE_PROVIDER_SITE_OTHER): Payer: Medicaid Other | Admitting: Women's Health

## 2021-05-22 ENCOUNTER — Other Ambulatory Visit: Payer: Self-pay

## 2021-05-22 VITALS — BP 96/60 | HR 88 | Wt 134.0 lb

## 2021-05-22 DIAGNOSIS — Z3A31 31 weeks gestation of pregnancy: Secondary | ICD-10-CM

## 2021-05-22 DIAGNOSIS — Z789 Other specified health status: Secondary | ICD-10-CM

## 2021-05-22 DIAGNOSIS — Z3480 Encounter for supervision of other normal pregnancy, unspecified trimester: Secondary | ICD-10-CM

## 2021-05-22 NOTE — Progress Notes (Signed)
Subjective:  Brianna Haley is a 30 y.o. G3P1011 at [redacted]w[redacted]d being seen today for ongoing prenatal care.  She is currently monitored for the following issues for this low-Haley pregnancy and has History of spontaneous abortion; Language barrier; Vitamin D insufficiency; and Supervision of normal pregnancy, antepartum on their problem list.  Patient reports no complaints.  Contractions: Not present. Vag. Bleeding: None.  Movement: Present. Denies leaking of fluid.   The following portions of the patient's history were reviewed and updated as appropriate: allergies, current medications, past family history, past medical history, past social history, past surgical history and problem list. Problem list updated.  Objective:   Vitals:   05/22/21 1110  BP: 96/60  Pulse: 88  Weight: 134 lb (60.8 kg)    Fetal Status: Fetal Heart Rate (bpm): 160 Fundal Height: 32 cm Movement: Present     General:  Alert, oriented and cooperative. Patient is in no acute distress.  Skin: Skin is warm and dry. No rash noted.   Cardiovascular: Normal heart rate noted  Respiratory: Normal respiratory effort, no problems with respiration noted  Abdomen: Soft, gravid, appropriate for gestational age. Pain/Pressure: Absent     Pelvic: Vag. Bleeding: None     Cervical exam deferred        Extremities: Normal range of motion.     Mental Status: Normal mood and affect. Normal behavior. Normal judgment and thought content.   Urinalysis:      Assessment and Plan:  Pregnancy: G3P1011 at [redacted]w[redacted]d  1. Supervision of other normal pregnancy, antepartum -discussed contraception, pt elects Paragard IUD  2. [redacted] weeks gestation of pregnancy  3. Language barrier -[name] interpreter used for entire visit  Preterm labor symptoms and general obstetric precautions including but not limited to vaginal bleeding, contractions, leaking of fluid and fetal movement were reviewed in detail with the patient. I discussed the assessment and  treatment plan with the patient. The patient was provided an opportunity to ask questions and all were answered. The patient agreed with the plan and demonstrated an understanding of the instructions. The patient was advised to call back or seek an in-person office evaluation/go to MAU at [facility] for any urgent or concerning symptoms. Please refer to After Visit Summary for other counseling recommendations.  Return in about 2 weeks (around 06/05/2021) for in-person LOB/APP OK.   Joshalyn Ancheta, Odie Sera, NP

## 2021-05-22 NOTE — Patient Instructions (Signed)
Maternity Assessment Unit (MAU)  The Maternity Assessment Unit (MAU) is located at the [facility] The address is: [address] [place], [place], [place] [address]. Please see map below for additional directions.    The Maternity Assessment Unit is designed to help you during your pregnancy, and for up to 6 weeks after delivery, with any pregnancy- or postpartum-related emergencies, if you think you are in labor, or if your water has broken. For example, if you experience nausea and vomiting, vaginal bleeding, severe abdominal or pelvic pain, elevated blood pressure or other problems related to your pregnancy or postpartum time, please come to the Maternity Assessment Unit for assistance.       Contraception Choices - www.bedsider.org Contraception, also called birth control, refers to methods or devices thatprevent pregnancy. Hormonal methods  Contraceptive implant A contraceptive implant is a thin, plastic tube that contains a hormone that prevents pregnancy. It is different from an intrauterine device (IUD). It is inserted into the upper part of the arm by a health care provider. Implants canbe effective for up to 3 years. Progestin-only injections Progestin-only injections are injections of progestin, a synthetic form of thehormone progesterone. They are given every 3 months by a health care provider. Birth control pills Birth control pills are pills that contain hormones that prevent pregnancy. They must be taken once a day, preferably at the same time each day. Aprescription is needed to use this method of contraception. Birth control patch The birth control patch contains hormones that prevent pregnancy. It is placed on the skin and must be changed once a week for three weeks and removed on thefourth week. A prescription is needed to use this method of contraception. Vaginal ring A vaginal ring contains hormones that prevent  pregnancy. It is placed in the vagina for three weeks and removed on the fourth week. After that, the process is repeated with a new ring. A prescription is needed to use this method ofcontraception. Emergency contraceptive Emergency contraceptives prevent pregnancy after unprotected sex. They come in pill form and can be taken up to 5 days after sex. They work best the sooner they are taken after having sex. Most emergency contraceptives are available without a prescription. This method should not be used as your only form ofbirth control. Barrier methods  Female condom A female condom is a thin sheath that is worn over the penis during sex. Condoms keep sperm from going inside a woman's body. They can be used with a sperm-killing substance (spermicide) to increase their effectiveness. They should be thrown away after one use. Female condom A female condom is a soft, loose-fitting sheath that is put into the vagina before sex. The condom keeps sperm from going inside a woman's body. Theyshould be thrown away after one use. Diaphragm A diaphragm is a soft, dome-shaped barrier. It is inserted into the vagina before sex, along with a spermicide. The diaphragm blocks sperm from entering the uterus, and the spermicide kills sperm. A diaphragm should be left in thevagina for 6-8 hours after sex and removed within 24 hours. A diaphragm is prescribed and fitted by a health care provider. A diaphragm should be replaced every 1-2 years, after giving birth, after gaining more than15 lb (6.8 kg), and after pelvic surgery. Cervical cap A cervical cap is a round, soft latex or plastic cup that fits over the cervix. It is inserted into the vagina before sex, along with spermicide. It blocks sperm from entering  the uterus. The cap should be left in place for 6-8 hours after sex and removed within 48 hours. A cervical cap must be prescribed andfitted by a health care provider. It should be replaced every 2  years. Sponge A sponge is a soft, circular piece of polyurethane foam with spermicide in it. The sponge helps block sperm from entering the uterus, and the spermicide kills sperm. To use it, you make it wet and then insert it into the vagina. It should be inserted before sex, left in for at least 6 hours after sex, and removed andthrown away within 30 hours. Spermicides Spermicides are chemicals that kill or block sperm from entering the cervix and uterus. They can come as a cream, jelly, suppository, foam, or tablet. A spermicide should be inserted into the vagina with an applicator at least 10-15 minutes before sex to allow time for it to work. The process must be repeatedevery time you have sex. Spermicides do not require a prescription. Intrauterine contraception Intrauterine device (IUD) An IUD is a T-shaped device that is put in a woman's uterus. There are two types: Hormone IUD.This type contains progestin, a synthetic form of the hormone progesterone. This type can stay in place for 3-5 years. Copper IUD.This type is wrapped in copper wire. It can stay in place for 10 years. Permanent methods of contraception Female tubal ligation In this method, a woman's fallopian tubes are sealed, tied, or blocked duringsurgery to prevent eggs from traveling to the uterus. Hysteroscopic sterilization In this method, a small, flexible insert is placed into each fallopian tube. The inserts cause scar tissue to form in the fallopian tubes and block them, so sperm cannot reach an egg. The procedure takes about 3 months to be effective.Another form of birth control must be used during those 3 months. Female sterilization This is a procedure to tie off the tubes that carry sperm (vasectomy). After the procedure, the man can still ejaculate fluid (semen). Another form of birth control must be used for 3 months after the procedure. Natural planning methods Natural family planning In this method, a couple does not  have sex on days when the woman could become pregnant. Calendar method In this method, the woman keeps track of the length of each menstrual cycle, identifies the days when pregnancy can happen, and does not have sex on those days. Ovulation method In this method, a couple avoids sex during ovulation. Symptothermal method This method involves not having sex during ovulation. The woman typically checks for ovulation bywatching changes in her temperature and in the consistency of cervical mucus. Post-ovulation method In this method, a couple waits to have sex until after ovulation. Where to find more information Centers for Disease Control and Prevention: FootballExhibition.com.br Summary Contraception, also called birth control, refers to methods or devices that prevent pregnancy. Hormonal methods of contraception include implants, injections, pills, patches, vaginal rings, and emergency contraceptives. Barrier methods of contraception can include female condoms, female condoms, diaphragms, cervical caps, sponges, and spermicides. There are two types of IUDs (intrauterine devices). An IUD can be put in a woman's uterus to prevent pregnancy for 3-5 years. Permanent sterilization can be done through a procedure for males and females. Natural family planning methods involve nothaving sex on days when the woman could become pregnant. This information is not intended to replace advice given to you by your health care provider. Make sure you discuss any questions you have with your healthcare provider. Document Revised: 04/23/2020 Document Reviewed: 04/23/2020 Elsevier Patient  Education  2022 [employer].       Preterm Labor The normal length of a pregnancy is 39-41 weeks. Preterm labor is when labor starts before 37 completed weeks of pregnancy. Babies who are born prematurely and survive may not be fully developed and may be at an increased risk for long-term problems such as cerebral palsy, developmental  delays, and vision andhearing problems. Babies who are born too early may have problems soon after birth. Premature babies may have problems regulating blood sugar, body temperature, heart rate, and breathing rate. These babies often have trouble with feeding. The risk ofhaving problems is highest for babies who are born before 34 weeks of pregnancy. What are the causes? The exact cause of this condition is not known. What increases the risk? You are more likely to have preterm labor if you have certain risk factors that relate to your medical history, problems with present and past pregnancies, andlifestyle factors. Medical history You have abnormalities of the uterus, including a short cervix. You have STIs (sexually transmitted infections) or other infections of the urinary tract and the vagina. You have chronic illnesses, such as blood clotting problems, diabetes, or high blood pressure. You are overweight or underweight. Present and past pregnancies You have had preterm labor before. You are pregnant with twins or other multiples. You have been diagnosed with a condition in which the placenta covers your cervix (placenta previa). You waited less than 18 months between giving birth and becoming pregnant again. Your unborn baby has some abnormalities. You have vaginal bleeding during pregnancy. You became pregnant through in vitro fertilization (IVF). Lifestyle and environmental factors You use tobacco products or drink alcohol. You use drugs. You have stress and no social support. You experience domestic violence. You are exposed to certain chemicals or environmental pollutants. Other factors You are younger than age 40 or older than age 30. What are the signs or symptoms? Symptoms of this condition include: Cramps similar to those that can happen during a menstrual period. The cramps may happen with diarrhea. Pain in the abdomen or lower back. Regular contractions that may feel  like tightening of the abdomen. A feeling of increased pressure in the pelvis. Increased watery or bloody mucus discharge from the vagina. Water breaking (ruptured amniotic sac). How is this diagnosed? This condition is diagnosed based on: Your medical history and a physical exam. A pelvic exam. An ultrasound. Monitoring your uterus for contractions. Other tests, including: A swab of the cervix to check for a chemical called fetal fibronectin. Urine tests. How is this treated? Treatment for this condition depends on the length of your pregnancy, your condition, and the health of your baby. Treatment may include: Taking medicines, such as: Hormone medicines. These may be given early in pregnancy to help support the pregnancy. Medicines to stop contractions. Medicines to help mature the baby's lungs. These may be prescribed if the risk of delivery is high. Medicines to help protect your baby from brain and nerve complications such as cerebral palsy. Bed rest. If the labor happens before 34 weeks of pregnancy, you may need to stay in the hospital. Delivery of the baby. Follow these instructions at home:  Do not use any products that contain nicotine or tobacco. These products include cigarettes, chewing tobacco, and vaping devices, such as e-cigarettes. If you need help quitting, ask your health care provider. Do not drink alcohol. Take over-the-counter and prescription medicines only as told by your health care provider. Rest as told by your  health care provider. Return to your normal activities as told by your health care provider. Ask your health care provider what activities are safe for you. Keep all follow-up visits. This is important. How is this prevented? To increase your chance of having a full-term pregnancy: Do not use drugs or take medicines that have not been prescribed to you during your pregnancy. Talk with your health care provider before taking any herbal supplements,  even if you have been taking them regularly. Make sure you gain a healthy amount of weight during your pregnancy. Watch for infection. If you think that you might have an infection, get it checked right away. Symptoms of infection may include: Fever. Abnormal vaginal discharge or discharge that smells bad. Pain or burning with urination. Needing to urinate urgently. Frequently urinating or passing small amounts of urine frequently. Blood in your urine or urine that smells bad or unusual. Where to find more information U.S. Department of Health and Cytogeneticist on Women's Health: http://hoffman.com/ [employer]: www.acog.org Centers for Disease Control and Prevention, Preterm Birth: FootballExhibition.com.br Contact a health care provider if: You think you are going into preterm labor. You have signs or symptoms of preterm labor. You have symptoms of infection. Get help right away if: You are having regular, painful contractions every 5 minutes or less. Your water breaks. Summary Preterm labor is labor that starts before you reach 37 weeks of pregnancy. Delivering your baby early increases your baby's risk of developing long-term problems. You are more likely to have preterm labor if you have certain risk factors that relate to your medical history, problems with present and past pregnancies, and lifestyle factors. Keep all follow-up visits. This is important. Contact a health care provider if you have signs or symptoms of preterm labor. This information is not intended to replace advice given to you by your health care provider. Make sure you discuss any questions you have with your healthcare provider. Document Revised: 11/20/2020 Document Reviewed: 11/20/2020 Elsevier Patient Education  2022 [employer].

## 2021-05-31 DIAGNOSIS — Z419 Encounter for procedure for purposes other than remedying health state, unspecified: Secondary | ICD-10-CM | POA: Diagnosis not present

## 2021-06-05 ENCOUNTER — Other Ambulatory Visit: Payer: Self-pay

## 2021-06-05 ENCOUNTER — Ambulatory Visit (INDEPENDENT_AMBULATORY_CARE_PROVIDER_SITE_OTHER): Payer: Medicaid Other | Admitting: Women's Health

## 2021-06-05 VITALS — BP 113/64 | HR 105 | Wt 136.8 lb

## 2021-06-05 DIAGNOSIS — Z3480 Encounter for supervision of other normal pregnancy, unspecified trimester: Secondary | ICD-10-CM

## 2021-06-05 DIAGNOSIS — Z3A33 33 weeks gestation of pregnancy: Secondary | ICD-10-CM

## 2021-06-05 DIAGNOSIS — Z789 Other specified health status: Secondary | ICD-10-CM

## 2021-06-05 NOTE — Patient Instructions (Addendum)
Maternity Assessment Unit (MAU)  The Maternity Assessment Unit (MAU) is located at the [facility]. The address is: [address], [address], [place], [place] [address]. Please see map below for additional directions.    The Maternity Assessment Unit is designed to help you during your pregnancy, and for up to 6 weeks after delivery, with any pregnancy- or postpartum-related emergencies, if you think you are in labor, or if your water has broken. For example, if you experience nausea and vomiting, vaginal bleeding, severe abdominal or pelvic pain, elevated blood pressure or other problems related to your pregnancy or postpartum time, please come to the Maternity Assessment Unit for assistance.       Preterm Labor The normal length of a pregnancy is 39-41 weeks. Preterm labor is when labor starts before 37 completed weeks of pregnancy. Babies who are born prematurely and survive may not be fully developed and may be at an increased risk for long-term problems such as cerebral palsy, developmental delays, and vision and hearing problems. Babies who are born too early may have problems soon after birth. Premature babies may have problems regulating blood sugar, body temperature, heart rate, and breathing rate. These babies often have trouble with feeding. The risk of having problems is highest for babies who are born before 34 weeks of pregnancy. What are the causes? The exact cause of this condition is not known. What increases the risk? You are more likely to have preterm labor if you have certain risk factors that relate to your medical history, problems with present and past pregnancies, and lifestyle factors. Medical history You have abnormalities of the uterus, including a short cervix. You have STIs (sexually transmitted infections) or other infections of the urinary tract and the vagina. You have chronic illnesses, such as blood clotting  problems, diabetes, or high blood pressure. You are overweight or underweight. Present and past pregnancies You have had preterm labor before. You are pregnant with twins or other multiples. You have been diagnosed with a condition in which the placenta covers your cervix (placenta previa). You waited less than 18 months between giving birth and becoming pregnant again. Your unborn baby has some abnormalities. You have vaginal bleeding during pregnancy. You became pregnant through in vitro fertilization (IVF). Lifestyle and environmental factors You use tobacco products or drink alcohol. You use drugs. You have stress and no social support. You experience domestic violence. You are exposed to certain chemicals or environmental pollutants. Other factors You are younger than age 17 or older than age 35. What are the signs or symptoms? Symptoms of this condition include: Cramps similar to those that can happen during a menstrual period. The cramps may happen with diarrhea. Pain in the abdomen or lower back. Regular contractions that may feel like tightening of the abdomen. A feeling of increased pressure in the pelvis. Increased watery or bloody mucus discharge from the vagina. Water breaking (ruptured amniotic sac). How is this diagnosed? This condition is diagnosed based on: Your medical history and a physical exam. A pelvic exam. An ultrasound. Monitoring your uterus for contractions. Other tests, including: A swab of the cervix to check for a chemical called fetal fibronectin. Urine tests. How is this treated? Treatment for this condition depends on the length of your pregnancy, your condition, and the health of your baby. Treatment may include: Taking medicines, such as: Hormone medicines. These may be given early in pregnancy to help support the pregnancy. Medicines to stop   contractions. Medicines to help mature the baby's lungs. These may be prescribed if the risk of  delivery is high. Medicines to help protect your baby from brain and nerve complications such as cerebral palsy. Bed rest. If the labor happens before 34 weeks of pregnancy, you may need to stay in the hospital. Delivery of the baby. Follow these instructions at home:  Do not use any products that contain nicotine or tobacco. These products include cigarettes, chewing tobacco, and vaping devices, such as e-cigarettes. If you need help quitting, ask your health care provider. Do not drink alcohol. Take over-the-counter and prescription medicines only as told by your health care provider. Rest as told by your health care provider. Return to your normal activities as told by your health care provider. Ask your health care provider what activities are safe for you. Keep all follow-up visits. This is important. How is this prevented? To increase your chance of having a full-term pregnancy: Do not use drugs or take medicines that have not been prescribed to you during your pregnancy. Talk with your health care provider before taking any herbal supplements, even if you have been taking them regularly. Make sure you gain a healthy amount of weight during your pregnancy. Watch for infection. If you think that you might have an infection, get it checked right away. Symptoms of infection may include: Fever. Abnormal vaginal discharge or discharge that smells bad. Pain or burning with urination. Needing to urinate urgently. Frequently urinating or passing small amounts of urine frequently. Blood in your urine or urine that smells bad or unusual. Where to find more information U.S. Department of Health and Human Services Office on Women's Health: www.womenshealth.gov The American College of Obstetricians and Gynecologists: www.acog.org Centers for Disease Control and Prevention, Preterm Birth: www.cdc.gov Contact a health care provider if: You think you are going into preterm labor. You have signs  or symptoms of preterm labor. You have symptoms of infection. Get help right away if: You are having regular, painful contractions every 5 minutes or less. Your water breaks. Summary Preterm labor is labor that starts before you reach 37 weeks of pregnancy. Delivering your baby early increases your baby's risk of developing long-term problems. You are more likely to have preterm labor if you have certain risk factors that relate to your medical history, problems with present and past pregnancies, and lifestyle factors. Keep all follow-up visits. This is important. Contact a health care provider if you have signs or symptoms of preterm labor. This information is not intended to replace advice given to you by your health care provider. Make sure you discuss any questions you have with your health care provider. Document Revised: 11/20/2020 Document Reviewed: 11/20/2020 Elsevier Patient Education  2022 Elsevier Inc.       Group B Streptococcus Test During Pregnancy Why am I having this test? Routine testing, also called screening, for group B streptococcus (GBS) is recommended for all pregnant women between the 36th and 37th week of pregnancy. GBS is a type of bacteria that can be passed from mother to baby during childbirth. Screening will help guide whether or not you will need treatment during labor and delivery to prevent complications such as: An infection in your uterus during labor. An infection in your uterus after delivery. A serious infection in your baby after delivery, such as pneumonia, meningitis, or sepsis. GBS screening is not often done before 36 weeks of pregnancy unless you go into labor prematurely. What happens if I have group   B streptococcus? If testing shows that you have GBS, your health care provider will recommend treatment with IV antibiotics during labor and delivery. This treatment significantly decreases the risk of complications for you and your baby. If you  have a planned C-section and you have GBS, you may not need to be treated with antibiotics because GBS is usually passed to babies after labor starts and your water breaks. If you are in labor or your water breaks before your C-section, it is possible for GBS to get into your uterus and be passed to your baby, so you might need treatment. Is there a chance I may not need to be tested? You may not need to be tested for GBS if: You have a urine test that shows GBS before 36 to 37 weeks. You had a baby with GBS infection after a previous delivery. In these cases, you will automatically be treated for GBS during labor and delivery. What is being tested? This test is done to check if you have group B streptococcus in your vagina or rectum. What kind of sample is taken? To collect samples for this test, your health care provider will swab your vagina and rectum with a cotton swab. The sample is then sent to the lab to see if GBS is present. What happens during the test?  You will remove your clothing from the waist down. You will lie down on an exam table in the same position as you would for a pelvic exam. Your health care provider will swab your vagina and rectum to collect samples for a culture test. You will be able to go home after the test and do all your usual activities. How are the results reported? The test results are reported as positive or negative. What do the results mean? A positive test means you are at risk for passing GBS to your baby during labor and delivery. Your health care provider will recommend that you are treated with an IV antibiotic during labor and delivery. A negative test means you are at very low risk of passing GBS to your baby. There is still a low risk of passing GBS to your baby because sometimes test results may report that you do not have a condition when you do (false-negative result) or there is a chance that you may become infected with GBS after the test is  done. You most likely will not need to be treated with an antibiotic during labor and delivery. Talk with your health care provider about what your results mean. Questions to ask your health care provider Ask your health care provider, or the department that is doing the test: When will my results be ready? How will I get my results? What are my treatment options? Summary Routine testing (screening) for group B streptococcus (GBS) is recommended for all pregnant women between the 36th and 37th week of pregnancy. GBS is a type of bacteria that can be passed from mother to baby during childbirth. If testing shows that you have GBS, your health care provider will recommend that you are treated with IV antibiotics during labor and delivery. This treatment almost always prevents infection in newborns. This information is not intended to replace advice given to you by your health care provider. Make sure you discuss any questions you have with your health care provider. Document Revised: 09/18/2020 Document Reviewed: 12/15/2018 Elsevier Patient Education  2022 Elsevier Inc.  

## 2021-06-05 NOTE — Progress Notes (Signed)
Subjective:  Brianna Haley is a 30 y.o. G3P1011 at [redacted]w[redacted]d being seen today for ongoing prenatal care.  She is currently monitored for the following issues for this low-risk pregnancy and has History of spontaneous abortion; Language barrier; Vitamin D insufficiency; and Supervision of normal pregnancy, antepartum on their problem list.  Patient reports no complaints.  Contractions: Not present. Vag. Bleeding: None.  Movement: Present. Denies leaking of fluid.   The following portions of the patient's history were reviewed and updated as appropriate: allergies, current medications, past family history, past medical history, past social history, past surgical history and problem list. Problem list updated.  Objective:   Vitals:   06/05/21 1044  BP: 113/64  Pulse: (!) 105  Weight: 136 lb 12.8 oz (62.1 kg)    Fetal Status: Fetal Heart Rate (bpm): 145 Fundal Height: 34 cm Movement: Present     General:  Alert, oriented and cooperative. Patient is in no acute distress.  Skin: Skin is warm and dry. No rash noted.   Cardiovascular: Normal heart rate noted  Respiratory: Normal respiratory effort, no problems with respiration noted  Abdomen: Soft, gravid, appropriate for gestational age. Pain/Pressure: Absent     Pelvic: Vag. Bleeding: None     Cervical exam deferred        Extremities: Normal range of motion.  Edema: None  Mental Status: Normal mood and affect. Normal behavior. Normal judgment and thought content.   Urinalysis:      Assessment and Plan:  Pregnancy: G3P1011 at [redacted]w[redacted]d  1. Supervision of other normal pregnancy, antepartum -GBS/cultures next visit  2. Language barrier -[name] interpreter used for entire visit  3. [redacted] weeks gestation of pregnancy  Preterm labor symptoms and general obstetric precautions including but not limited to vaginal bleeding, contractions, leaking of fluid and fetal movement were reviewed in detail with the patient. I discussed the assessment and  treatment plan with the patient. The patient was provided an opportunity to ask questions and all were answered. The patient agreed with the plan and demonstrated an understanding of the instructions. The patient was advised to call back or seek an in-person office evaluation/go to MAU at [facility] for any urgent or concerning symptoms. Please refer to After Visit Summary for other counseling recommendations.  Return in about 19 days (around 06/24/2021) for in-person LOB/APP OK - needs appt at 36w for GBS.   Lashea Goda, Odie Sera, NP

## 2021-06-24 ENCOUNTER — Other Ambulatory Visit (HOSPITAL_COMMUNITY)
Admission: RE | Admit: 2021-06-24 | Discharge: 2021-06-24 | Disposition: A | Discharge status: Home / Self Care | Payer: Medicaid Other | Source: Ambulatory Visit | Attending: Advanced Practice Midwife | Admitting: Advanced Practice Midwife

## 2021-06-24 ENCOUNTER — Other Ambulatory Visit: Payer: Self-pay

## 2021-06-24 ENCOUNTER — Ambulatory Visit (INDEPENDENT_AMBULATORY_CARE_PROVIDER_SITE_OTHER): Payer: Medicaid Other | Admitting: Advanced Practice Midwife

## 2021-06-24 VITALS — BP 115/67 | HR 100 | Wt 141.1 lb

## 2021-06-24 DIAGNOSIS — Z789 Other specified health status: Secondary | ICD-10-CM

## 2021-06-24 DIAGNOSIS — Z3480 Encounter for supervision of other normal pregnancy, unspecified trimester: Secondary | ICD-10-CM

## 2021-06-24 DIAGNOSIS — Z3A36 36 weeks gestation of pregnancy: Secondary | ICD-10-CM

## 2021-06-24 DIAGNOSIS — K5900 Constipation, unspecified: Secondary | ICD-10-CM

## 2021-06-24 MED ORDER — DOCUSATE SODIUM 100 MG PO CAPS: 100.0000 mg | ORAL_CAPSULE | Freq: Two times a day (BID) | ORAL | 0 refills | Status: DC

## 2021-06-24 MED ORDER — POLYETHYLENE GLYCOL 3350 17 GM/SCOOP PO POWD: 17.0000 g | Freq: Every day | ORAL | 0 refills | Status: DC

## 2021-06-24 NOTE — Progress Notes (Signed)
   PRENATAL VISIT NOTE  Subjective:  Brianna Haley is a 30 y.o. G3P1011 at [redacted]w[redacted]d being seen today for ongoing prenatal care.  She is currently monitored for the following issues for this low-risk pregnancy and has History of spontaneous abortion; Language barrier; Vitamin D insufficiency; and Supervision of normal pregnancy, antepartum on their problem list.  Patient reports  constipation .  Contractions: Not present. Vag. Bleeding: None.  Movement: Present. Denies leaking of fluid.   The following portions of the patient's history were reviewed and updated as appropriate: allergies, current medications, past family history, past medical history, past social history, past surgical history and problem list.   Objective:   Vitals:   06/24/21 1147  BP: 115/67  Pulse: 100  Weight: 141 lb 1.6 oz (64 kg)    Fetal Status:   Fundal Height: 37 cm Movement: Present  Presentation: Vertex  General:  Alert, oriented and cooperative. Patient is in no acute distress.  Skin: Skin is warm and dry. No rash noted.   Cardiovascular: Normal heart rate noted  Respiratory: Normal respiratory effort, no problems with respiration noted  Abdomen: Soft, gravid, appropriate for gestational age.  Pain/Pressure: Absent     Pelvic: Cervical exam performed in the presence of a chaperone Dilation: Fingertip Effacement (%): 0 Station: -3  Extremities: Normal range of motion.  Edema: None  Mental Status: Normal mood and affect. Normal behavior. Normal judgment and thought content.   Assessment and Plan:  Pregnancy: G3P1011 at [redacted]w[redacted]d  1. Supervision of other normal pregnancy, antepartum --Anticipatory guidance about next visits/weeks of pregnancy given. --Next visit in 1 week  - Culture, beta strep (group b only) - GC/Chlamydia probe amp ([place])not at [facility]  2. [redacted] weeks gestation of pregnancy   3. Language barrier --Video interpreter in [place] language used for all communication   4. Constipation,  unspecified constipation type --Reviewed dietary changes  - docusate sodium (COLACE) 100 MG capsule; Take 1 capsule (100 mg total) by mouth 2 (two) times daily.  Dispense: 10 capsule; Refill: 0 - polyethylene glycol powder (GLYCOLAX/MIRALAX) 17 GM/SCOOP powder; Take 17 g by mouth daily.  Dispense: 255 g; Refill: 0  Preterm labor symptoms and general obstetric precautions including but not limited to vaginal bleeding, contractions, leaking of fluid and fetal movement were reviewed in detail with the patient. Please refer to After Visit Summary for other counseling recommendations.   Return in about 1 week (around 07/01/2021).  Future Appointments  Date Time Provider Department Center  07/01/2021  9:55 AM Brock Bad, MD [facility] None    Sharen Counter, CNM

## 2021-06-26 LAB — GC/CHLAMYDIA PROBE AMP (~~LOC~~) NOT AT ARMC
Chlamydia: NEGATIVE
Comment: NEGATIVE
Comment: NORMAL
Neisseria Gonorrhea: NEGATIVE

## 2021-06-28 LAB — CULTURE, BETA STREP (GROUP B ONLY): Strep Gp B Culture: NEGATIVE

## 2021-07-01 ENCOUNTER — Ambulatory Visit (INDEPENDENT_AMBULATORY_CARE_PROVIDER_SITE_OTHER): Payer: Medicaid Other | Admitting: Obstetrics

## 2021-07-01 ENCOUNTER — Encounter: Payer: Self-pay | Admitting: Obstetrics

## 2021-07-01 ENCOUNTER — Other Ambulatory Visit: Payer: Self-pay

## 2021-07-01 VITALS — BP 117/72 | HR 101 | Wt 144.0 lb

## 2021-07-01 DIAGNOSIS — Z3480 Encounter for supervision of other normal pregnancy, unspecified trimester: Secondary | ICD-10-CM

## 2021-07-01 DIAGNOSIS — Z789 Other specified health status: Secondary | ICD-10-CM

## 2021-07-01 DIAGNOSIS — Z419 Encounter for procedure for purposes other than remedying health state, unspecified: Secondary | ICD-10-CM | POA: Diagnosis not present

## 2021-07-01 NOTE — Progress Notes (Signed)
Subjective:  Brianna Haley is a 30 y.o. G3P1011 at [redacted]w[redacted]d being seen today for ongoing prenatal care.  She is currently monitored for the following issues for this low-risk pregnancy and has History of spontaneous abortion; Language barrier; Vitamin D insufficiency; and Supervision of normal pregnancy, antepartum on their problem list.  Patient reports no complaints.  Contractions: Irregular. Vag. Bleeding: None.  Movement: Present. Denies leaking of fluid.   The following portions of the patient's history were reviewed and updated as appropriate: allergies, current medications, past family history, past medical history, past social history, past surgical history and problem list. Problem list updated.  Objective:   Vitals:   07/01/21 0955  BP: 117/72  Pulse: (!) 101  Weight: 144 lb (65.3 kg)    Fetal Status:     Movement: Present     General:  Alert, oriented and cooperative. Patient is in no acute distress.  Skin: Skin is warm and dry. No rash noted.   Cardiovascular: Normal heart rate noted  Respiratory: Normal respiratory effort, no problems with respiration noted  Abdomen: Soft, gravid, appropriate for gestational age. Pain/Pressure: Present     Pelvic:  Cervical exam deferred        Extremities: Normal range of motion.     Mental Status: Normal mood and affect. Normal behavior. Normal judgment and thought content.   Urinalysis:      Assessment and Plan:  Pregnancy: G3P1011 at [redacted]w[redacted]d  There are no diagnoses linked to this encounter. Term labor symptoms and general obstetric precautions including but not limited to vaginal bleeding, contractions, leaking of fluid and fetal movement were reviewed in detail with the patient. Please refer to After Visit Summary for other counseling recommendations.   Return in about 1 week (around 07/08/2021) for ROB.   Brock Bad, MD  07/01/21

## 2021-07-04 ENCOUNTER — Inpatient Hospital Stay (HOSPITAL_COMMUNITY): Payer: Medicaid Other | Admitting: Anesthesiology

## 2021-07-04 ENCOUNTER — Encounter (HOSPITAL_COMMUNITY): Payer: Self-pay

## 2021-07-04 ENCOUNTER — Other Ambulatory Visit: Payer: Self-pay

## 2021-07-04 ENCOUNTER — Inpatient Hospital Stay (HOSPITAL_COMMUNITY)
Admission: AD | Admit: 2021-07-04 | Discharge: 2021-07-06 | DRG: 807 | Disposition: A | Discharge status: Home / Self Care | Payer: Medicaid Other | Attending: Obstetrics & Gynecology | Admitting: Obstetrics & Gynecology

## 2021-07-04 DIAGNOSIS — O26893 Other specified pregnancy related conditions, third trimester: Secondary | ICD-10-CM | POA: Diagnosis not present

## 2021-07-04 DIAGNOSIS — Z20822 Contact with and (suspected) exposure to covid-19: Secondary | ICD-10-CM | POA: Diagnosis present

## 2021-07-04 DIAGNOSIS — Z3A37 37 weeks gestation of pregnancy: Secondary | ICD-10-CM

## 2021-07-04 DIAGNOSIS — Z758 Other problems related to medical facilities and other health care: Secondary | ICD-10-CM | POA: Diagnosis present

## 2021-07-04 DIAGNOSIS — O471 False labor at or after 37 completed weeks of gestation: Secondary | ICD-10-CM | POA: Diagnosis not present

## 2021-07-04 DIAGNOSIS — Z789 Other specified health status: Secondary | ICD-10-CM | POA: Diagnosis present

## 2021-07-04 LAB — CBC
HCT: 37.9 % (ref 36.0–46.0)
Hemoglobin: 12 g/dL (ref 12.0–15.0)
MCH: 27.8 pg (ref 26.0–34.0)
MCHC: 31.7 g/dL (ref 30.0–36.0)
MCV: 87.7 fL (ref 80.0–100.0)
Platelets: 281 10*3/uL (ref 150–400)
RBC: 4.32 MIL/uL (ref 3.87–5.11)
RDW: 13.1 % (ref 11.5–15.5)
WBC: 10.6 10*3/uL — ABNORMAL HIGH (ref 4.0–10.5)
nRBC: 0 % (ref 0.0–0.2)

## 2021-07-04 LAB — RPR: RPR Ser Ql: NONREACTIVE

## 2021-07-04 LAB — RESP PANEL BY RT-PCR (FLU A&B, COVID) ARPGX2
Influenza A by PCR: NEGATIVE
Influenza B by PCR: NEGATIVE
SARS Coronavirus 2 by RT PCR: NEGATIVE

## 2021-07-04 MED ORDER — COCONUT OIL OIL: 1.0000 "application " | TOPICAL_OIL | Status: DC | PRN

## 2021-07-04 MED ORDER — DIPHENHYDRAMINE HCL 50 MG/ML IJ SOLN: 12.5000 mg | INTRAMUSCULAR | Status: DC | PRN

## 2021-07-04 MED ORDER — BENZOCAINE-MENTHOL 20-0.5 % EX AERO: 1.0000 "application " | INHALATION_SPRAY | CUTANEOUS | Status: DC | PRN

## 2021-07-04 MED ORDER — WITCH HAZEL-GLYCERIN EX PADS: 1.0000 "application " | MEDICATED_PAD | CUTANEOUS | Status: DC | PRN

## 2021-07-04 MED ORDER — EPHEDRINE 5 MG/ML INJ
10.0000 mg | INTRAVENOUS | Status: DC | PRN
  Filled 2021-07-04: qty 2

## 2021-07-04 MED ORDER — TETANUS-DIPHTH-ACELL PERTUSSIS 5-2.5-18.5 LF-MCG/0.5 IM SUSY: 0.5000 mL | PREFILLED_SYRINGE | Freq: Once | INTRAMUSCULAR | Status: DC

## 2021-07-04 MED ORDER — MEASLES, MUMPS & RUBELLA VAC IJ SOLR: 0.5000 mL | Freq: Once | INTRAMUSCULAR | Status: DC

## 2021-07-04 MED ORDER — SODIUM CHLORIDE 0.9 % IV SOLN: 250.0000 mL | INTRAVENOUS | Status: DC | PRN

## 2021-07-04 MED ORDER — LIDOCAINE HCL (PF) 1 % IJ SOLN: 30.0000 mL | INTRAMUSCULAR | Status: DC | PRN

## 2021-07-04 MED ORDER — SOD CITRATE-CITRIC ACID 500-334 MG/5ML PO SOLN: 30.0000 mL | ORAL | Status: DC | PRN

## 2021-07-04 MED ORDER — FENTANYL-BUPIVACAINE-NACL 0.5-0.125-0.9 MG/250ML-% EP SOLN
12.0000 mL/h | EPIDURAL | Status: DC | PRN
Start: 2021-07-04 — End: 2021-07-04
  Administered 2021-07-04: 12 mL/h via EPIDURAL
  Filled 2021-07-04: qty 250

## 2021-07-04 MED ORDER — ONDANSETRON HCL 4 MG PO TABS: 4.0000 mg | ORAL_TABLET | ORAL | Status: DC | PRN

## 2021-07-04 MED ORDER — DIPHENHYDRAMINE HCL 25 MG PO CAPS: 25.0000 mg | ORAL_CAPSULE | Freq: Four times a day (QID) | ORAL | Status: DC | PRN

## 2021-07-04 MED ORDER — OXYCODONE-ACETAMINOPHEN 5-325 MG PO TABS: 1.0000 | ORAL_TABLET | ORAL | Status: DC | PRN

## 2021-07-04 MED ORDER — ACETAMINOPHEN 325 MG PO TABS
650.0000 mg | ORAL_TABLET | ORAL | Status: DC | PRN
  Administered 2021-07-04: 650 mg via ORAL
  Filled 2021-07-04: qty 2

## 2021-07-04 MED ORDER — OXYTOCIN BOLUS FROM INFUSION: 333.0000 mL | Freq: Once | INTRAVENOUS | Status: DC

## 2021-07-04 MED ORDER — PHENYLEPHRINE 40 MCG/ML (10ML) SYRINGE FOR IV PUSH (FOR BLOOD PRESSURE SUPPORT)
80.0000 ug | PREFILLED_SYRINGE | INTRAVENOUS | Status: DC | PRN
Start: 2021-07-04 — End: 2021-07-04
  Filled 2021-07-04 (×2): qty 10

## 2021-07-04 MED ORDER — SODIUM CHLORIDE 0.9% FLUSH: 3.0000 mL | Freq: Two times a day (BID) | INTRAVENOUS | Status: DC

## 2021-07-04 MED ORDER — PRENATAL MULTIVITAMIN CH
1.0000 | ORAL_TABLET | Freq: Every day | ORAL | Status: DC
  Administered 2021-07-05 – 2021-07-06 (×2): 1 via ORAL
  Filled 2021-07-04 (×2): qty 1

## 2021-07-04 MED ORDER — ONDANSETRON HCL 4 MG/2ML IJ SOLN: 4.0000 mg | Freq: Four times a day (QID) | INTRAMUSCULAR | Status: DC | PRN

## 2021-07-04 MED ORDER — SODIUM CHLORIDE 0.9% FLUSH: 3.0000 mL | INTRAVENOUS | Status: DC | PRN

## 2021-07-04 MED ORDER — OXYCODONE-ACETAMINOPHEN 5-325 MG PO TABS: 2.0000 | ORAL_TABLET | ORAL | Status: DC | PRN

## 2021-07-04 MED ORDER — OXYTOCIN-SODIUM CHLORIDE 30-0.9 UT/500ML-% IV SOLN
2.5000 [IU]/h | INTRAVENOUS | Status: DC
  Filled 2021-07-04: qty 500

## 2021-07-04 MED ORDER — PHENYLEPHRINE 40 MCG/ML (10ML) SYRINGE FOR IV PUSH (FOR BLOOD PRESSURE SUPPORT)
80.0000 ug | PREFILLED_SYRINGE | INTRAVENOUS | Status: DC | PRN
Start: 2021-07-04 — End: 2021-07-04
  Filled 2021-07-04: qty 10

## 2021-07-04 MED ORDER — DIBUCAINE (PERIANAL) 1 % EX OINT: 1.0000 "application " | TOPICAL_OINTMENT | CUTANEOUS | Status: DC | PRN

## 2021-07-04 MED ORDER — SIMETHICONE 80 MG PO CHEW: 80.0000 mg | CHEWABLE_TABLET | ORAL | Status: DC | PRN

## 2021-07-04 MED ORDER — LACTATED RINGERS IV SOLN: INTRAVENOUS | Status: DC

## 2021-07-04 MED ORDER — IBUPROFEN 600 MG PO TABS
600.0000 mg | ORAL_TABLET | Freq: Four times a day (QID) | ORAL | Status: DC
  Administered 2021-07-04 – 2021-07-06 (×8): 600 mg via ORAL
  Filled 2021-07-04 (×8): qty 1

## 2021-07-04 MED ORDER — LIDOCAINE HCL (PF) 1 % IJ SOLN
INTRAMUSCULAR | Status: DC | PRN
  Administered 2021-07-04: 9 mL via EPIDURAL

## 2021-07-04 MED ORDER — LACTATED RINGERS IV SOLN
500.0000 mL | INTRAVENOUS | Status: DC | PRN
  Administered 2021-07-04: 1000 mL via INTRAVENOUS

## 2021-07-04 MED ORDER — ONDANSETRON HCL 4 MG/2ML IJ SOLN: 4.0000 mg | INTRAMUSCULAR | Status: DC | PRN

## 2021-07-04 MED ORDER — LACTATED RINGERS IV SOLN: 500.0000 mL | Freq: Once | INTRAVENOUS | Status: DC

## 2021-07-04 MED ORDER — SENNOSIDES-DOCUSATE SODIUM 8.6-50 MG PO TABS
2.0000 | ORAL_TABLET | ORAL | Status: DC
  Administered 2021-07-05 – 2021-07-06 (×2): 2 via ORAL
  Filled 2021-07-04 (×2): qty 2

## 2021-07-04 MED ORDER — ACETAMINOPHEN 325 MG PO TABS: 650.0000 mg | ORAL_TABLET | ORAL | Status: DC | PRN

## 2021-07-04 NOTE — Progress Notes (Signed)
To 212 via w/c. FHT 140 when EFM removed in MAU for transfer to [facility]

## 2021-07-04 NOTE — ED Notes (Signed)
Report called to MAU  

## 2021-07-04 NOTE — Progress Notes (Signed)
Brianna Haley is a 30 y.o. G3P1011 at [redacted]w[redacted]d admitted for spontaneous onset of labor  Subjective: Pt resting in bed, comfortable with epidural. FOB at bedside for support. Interpreter present via [employer] Video for exam.  Objective: BP (!) 102/57   Pulse 96   Temp 98.3 F (36.8 C) (Oral)   Resp 18   Ht 5\' 4"  (1.626 m)   Wt 142 lb (64.4 kg)   LMP 10/13/2020   SpO2 100%   BMI 24.37 kg/m  No intake/output data recorded. Total I/O In: -  Out: 500 [Urine:500]  FHT:  FHR: 135 bpm, variability: moderate,  accelerations:  Present,  decelerations:  Absent UC:   irregular, every 3-7 minutes SVE:   Dilation: 9 Effacement (%): 90 Station: 0 Exam by:: [email or web address] CNM (With bulging bag)  Labs: Lab Results  Component Value Date   WBC 10.6 (H) 07/04/2021   HGB 12.0 07/04/2021   HCT 37.9 07/04/2021   MCV 87.7 07/04/2021   PLT 281 07/04/2021   Assessment / Plan: Spontaneous labor, progressing normally  Labor:  Progressing well, AROM performed with clear fluid Preeclampsia:   none Fetal Wellbeing:  Category I Pain Control:  Epidural I/D:  n/a Anticipated MOD:  NSVD  09/03/2021 07/04/2021, 9:49 AM

## 2021-07-04 NOTE — Progress Notes (Signed)
Brianna Haley is a 30 y.o. G3P1011 at [redacted]w[redacted]d admitted for spontaneous onset of labor  Subjective: Pt resting in bed, comfortable with epidural. FOB at bedside for support. Interpreter present via [employer] Video for exam.  Objective: BP (!) 96/50   Pulse 86   Temp 99.3 F (37.4 C) (Oral)   Resp 16   Ht 5\' 4"  (1.626 m)   Wt 142 lb (64.4 kg)   LMP 10/13/2020   SpO2 100%   BMI 24.37 kg/m  No intake/output data recorded. Total I/O In: -  Out: 500 [Urine:500]  FHT:  FHR: 135 bpm, variability: moderate,  accelerations:  Present,  decelerations:  Absent UC:   regular, q 2-3 SVE:   Dilation: Lip/rim Effacement (%): 100 Station: 0 Exam by:: [email or web address], CNM  Labs: Lab Results  Component Value Date   WBC 10.6 (H) 07/04/2021   HGB 12.0 07/04/2021   HCT 37.9 07/04/2021   MCV 87.7 07/04/2021   PLT 281 07/04/2021   Assessment / Plan: Spontaneous labor, progressing normally  Labor:  Progressing well, repositioned with peanut ball Preeclampsia:   none Fetal Wellbeing:  Category I Pain Control:  Epidural I/D:  n/a Anticipated MOD:  NSVD  09/03/2021 07/04/2021, 12:14 PM

## 2021-07-04 NOTE — ED Provider Notes (Signed)
Emergency Medicine Provider OB Triage Evaluation Note  Brianna Haley is a 30 y.o. female, G3P1011, at [redacted]w[redacted]d gestation who presents to the emergency department with complaints of contractions that started 2 hours ago.  She reports that contractions have been coming on every 5 minutes.  No leakage of fluid, vaginal bleeding, urge to push, chest pain, shortness of breath, vomiting.  No treatment prior to arrival.  Review of  Systems  Positive: Contractions Negative: Leakage of fluid, vaginal bleeding, urged to push, chest pain, shortness of breath, vomiting  Physical Exam  BP 97/72 (BP Location: Left Arm)   Pulse 87   Temp 98.4 F (36.9 C)   Resp 18   LMP 10/13/2020   SpO2 99%  General: Awake, no distress, vital signs are stable HEENT: Atraumatic  Resp: Normal effort  Cardiac: Normal rate Abd: Gravid MSK: Moves all extremities without difficulty Neuro: Speech clear  Medical Decision Making  Pt evaluated for pregnancy concern and is stable for transfer to MAU. Pt is in agreement with plan for transfer.  4:07 AM Discussed with MAU APP, Wynelle Bourgeois, who accepts patient in transfer is that she has stable vital signs.  No leakage of fluid to suggest rupture of membranes.  She is hemodynamically stable and does not appear to be actively laboring.  Clinical Impression  No diagnosis found.     Barkley Boards, PA-C 07/04/21 Asencion Noble, MD 07/04/21 [phone]

## 2021-07-04 NOTE — Discharge Summary (Signed)
Postpartum Discharge Summary    Patient Name: Brianna Haley DOB: Mar 06, 1991 MRN: [id number]  Date of admission: 07/04/2021 Delivery date:07/04/2021  Delivering provider: Patriciaann Clan  Date of discharge: 07/06/2021  Admitting diagnosis: Normal labor [O80, Z37.9] Intrauterine pregnancy: [redacted]w[redacted]d    Secondary diagnosis:  Active Problems:   Language barrier   Indication for care in labor or delivery   Normal labor  Additional problems: none    Discharge diagnosis: Term Pregnancy Delivered                                              Post partum procedures: none Augmentation: AROM Complications: None  Hospital course: Onset of Labor With Vaginal Delivery      30y.o. yo G3P1011 at 36w5das admitted in Active Labor on 07/04/2021. Patient had an uncomplicated labor course as follows:  Membrane Rupture Time/Date: 9:12 AM ,07/04/2021   Delivery Method:Vaginal, Spontaneous  Episiotomy: None  Lacerations:    Patient had an uncomplicated postpartum course.  She is ambulating, tolerating a regular diet, passing flatus, and urinating well. Patient is discharged home in stable condition on 07/06/21.  Newborn Data: Birth date:07/04/2021  Birth time:1:27 PM  Gender:Female  Living status:Living  Apgars:9 ,9  Weight:3104 g   Magnesium Sulfate received: No BMZ received: No Rhophylac:N/A MMR:N/A T-DaP:Given prenatally Flu: Yes Transfusion:No  Physical exam  Vitals:   07/04/21 1100 07/04/21 1130 07/04/21 1200 07/04/21 1230  BP: 106/62 110/62 (!) 96/50 (!) 95/44  Pulse: 98 93 86 87  Resp:  18 16   Temp:   99.3 F (37.4 C)   TempSrc:   Oral   SpO2:      Weight:      Height:       General: alert, cooperative, and no distress Lochia: appropriate Uterine Fundus: firm Incision: N/A DVT Evaluation: No evidence of DVT seen on physical exam. Labs: Lab Results  Component Value Date   WBC 10.6 (H) 07/04/2021   HGB 12.0 07/04/2021   HCT 37.9 07/04/2021   MCV 87.7 07/04/2021   PLT 281  07/04/2021   CMP Latest Ref Rng & Units 01/11/2020  Glucose 65 - 99 mg/dL 80  BUN 6 - 20 mg/dL 10  Creatinine 0.57 - 1.00 mg/dL 0.61  Sodium 134 - 144 mmol/L 139  Potassium 3.5 - 5.2 mmol/L 4.2  Chloride 96 - 106 mmol/L 103  CO2 20 - 29 mmol/L 22  Calcium 8.7 - 10.2 mg/dL 9.9  Total Protein 6.0 - 8.5 g/dL 7.6  Total Bilirubin 0.0 - 1.2 mg/dL 0.7  Alkaline Phos 39 - 117 IU/L 60  AST 0 - 40 IU/L 12  ALT 0 - 32 IU/L 13   Edinburgh Score: No flowsheet data found.   After visit meds:  Allergies as of 07/06/2021   No Known Allergies      Medication List     STOP taking these medications    cholecalciferol 25 MCG (1000 UNIT) tablet Commonly known as: VITAMIN D3   esomeprazole 20 MG capsule Commonly known as: NexIUM   fluticasone 50 MCG/ACT nasal spray Commonly known as: FLONASE       TAKE these medications    acetaminophen 325 MG tablet Commonly known as: Tylenol Take 2 tablets (650 mg total) by mouth every 4 (four) hours as needed (for pain scale < 4).   Blood  Pressure Monitor Kit 1 Device by Does not apply route once a week. To be monitored Regularly at home.   docusate sodium 100 MG capsule Commonly known as: Colace Take 1 capsule (100 mg total) by mouth 2 (two) times daily.   ibuprofen 600 MG tablet Commonly known as: ADVIL Take 1 tablet (600 mg total) by mouth every 6 (six) hours.   polyethylene glycol powder 17 GM/SCOOP powder Commonly known as: GLYCOLAX/MIRALAX Take 17 g by mouth daily.   Prenate Mini 18-0.6-0.4-350 MG Caps Take 1 capsule by mouth daily before breakfast.         Discharge home in stable condition Infant Feeding: Bottle Infant Disposition:home with mother Discharge instruction: per After Visit Summary and Postpartum booklet. Activity: Advance as tolerated. Pelvic rest for 6 weeks.  Diet: routine diet Future Appointments: Future Appointments  Date Time Provider Clay  07/08/2021  9:35 AM Shelly Bombard, MD  Shirley None   Follow up Visit: Please schedule this patient for a In person postpartum visit in 6 weeks with the following provider: Any provider. Additional Postpartum F/U: None   Low risk pregnancy complicated by:  None Delivery mode:  Vaginal, Spontaneous  Anticipated Birth Control:  IUD (Paraguard)   Mallie Snooks, MSN, CNM Certified Nurse Midwife, Product/process development scientist for [employer], [place]

## 2021-07-04 NOTE — Anesthesia Procedure Notes (Signed)
Epidural Patient location during procedure: OB Start time: 07/04/2021 6:33 AM End time: 07/04/2021 6:36 AM  Staffing Anesthesiologist: Leilani Able, MD Performed: anesthesiologist   Preanesthetic Checklist Completed: patient identified, IV checked, site marked, risks and benefits discussed, surgical consent, monitors and equipment checked, pre-op evaluation and timeout performed  Epidural Patient position: sitting Prep: DuraPrep and site prepped and draped Patient monitoring: continuous pulse ox and blood pressure Approach: midline Location: L3-L4 Injection technique: LOR air  Needle:  Needle type: Tuohy  Needle gauge: 17 G Needle length: 9 cm and 9 Needle insertion depth: 6 cm Catheter type: closed end flexible Catheter size: 19 Gauge Catheter at skin depth: 11 cm Test dose: negative and Other  Assessment Events: blood not aspirated, injection not painful, no injection resistance, no paresthesia and negative IV test  Additional Notes Reason for block:procedure for pain

## 2021-07-04 NOTE — MAU Note (Signed)
Ctxs since 0100. Denies LOF or VB.

## 2021-07-04 NOTE — H&P (Addendum)
OBSTETRIC ADMISSION HISTORY AND PHYSICAL  Brianna Haley is a 30 y.o. female G3P1011 with IUP at 39w5dby LMP presenting for labor. She reports +FMs, No LOF, no VB, no blurry vision, headaches or peripheral edema, and RUQ pain.  She plans on bottle feeding. She request Paragard OP for birth control. [place] [facility]  Estimated Date of Delivery: 07/20/21  Sono:   02/25/21_0 , CWD, normal anatomy, cephalic presentation, anterior placental lie, 299g, 61% EFW  Prenatal History/Complications:  N/A  Past Medical History: Past Medical History:  Diagnosis Date   Abdominal pain    Body aches    Chest discomfort    Constipation    Cough    Fatigue    Feeling of sadness    Fever    GERD (gastroesophageal reflux disease)    Helicobacter pylori antibody positive    History of spontaneous abortion    Language barrier, cultural differences    Malaise    Maternal varicella, non-immune    RBBB    Sinusitis chronic, frontal    SOB (shortness of breath)     Past Surgical History: History reviewed. No pertinent surgical history.  Obstetrical History: OB History     Gravida  3   Para  1   Term  1   Preterm  0   AB  1   Living  1      SAB  1   IAB  0   Ectopic  0   Multiple  0   Live Births  1           Social History Social History   Socioeconomic History   Marital status: Married    Spouse name: Not on file   Number of children: Not on file   Years of education: Not on file   Highest education level: Not on file  Occupational History   Not on file  Tobacco Use   Smoking status: Never   Smokeless tobacco: Never  Vaping Use   Vaping Use: Never used  Substance and Sexual Activity   Alcohol use: No   Drug use: No   Sexual activity: Yes    Partners: Male    Birth control/protection: None    Comment: pregnant   Other Topics Concern   Not on file  Social History Narrative   Not on file   Social  Determinants of Health   Financial Resource Strain: Not on file  Food Insecurity: Not on file  Transportation Needs: Not on file  Physical Activity: Not on file  Stress: Not on file  Social Connections: Not on file    Family History: Family History  Problem Relation Age of Onset   Healthy Mother    Healthy Father     Allergies: No Known Allergies  Medications Prior to Admission  Medication Sig Dispense Refill Last Dose   Prenat-FeCbn-FeAsp-Meth-FA-DHA (PRENATE MINI) 18-0.6-0.4-350 MG CAPS Take 1 capsule by mouth daily before breakfast. 90 capsule 4 07/03/2021   Blood Pressure Monitor KIT 1 Device by Does not apply route once a week. To be monitored Regularly at home. 1 kit 0    cholecalciferol (VITAMIN D3) 25 MCG (1000 UNIT) tablet Take 1,000 Units by mouth daily. (Patient not taking: No sig reported)      docusate sodium (COLACE) 100 MG capsule Take 1 capsule (100 mg total) by mouth 2 (two) times daily. 10 capsule 0    esomeprazole (NEXIUM)  20 MG capsule Take 1 capsule (20 mg total) by mouth daily. (Patient not taking: No sig reported) 30 capsule 11    fluticasone (FLONASE) 50 MCG/ACT nasal spray Place 2 sprays into both nostrils daily. (Patient not taking: No sig reported) 16 g 6    polyethylene glycol powder (GLYCOLAX/MIRALAX) 17 GM/SCOOP powder Take 17 g by mouth daily. 255 g 0      Review of Systems   All systems reviewed and negative except as stated in HPI  Blood pressure 126/67, pulse 92, temperature 98.3 F (36.8 C), temperature source Oral, resp. rate 18, height 5' 4" (1.626 m), weight 64.4 kg, last menstrual period 10/13/2020, SpO2 99 %. General appearance: alert, cooperative, and appears stated age Lungs: clear to auscultation bilaterally Heart: regular rate and rhythm Abdomen: soft, non-tender; bowel sounds normal Pelvic: As stated below  Extremities: Homans sign is negative, no sign of DVT Presentation: cephalic Fetal monitoringBaseline: 135 bpm, Variability:  Good {> 6 bpm), Accelerations: Reactive, and Decelerations: Absent Uterine activity: q66mn Dilation: 6 Effacement (%): 90 Station: -2 Exam by:: JBlima SingerRNC   Prenatal labs: ABO, Rh: --/--/PENDING (08/04 08270 Antibody: PENDING (08/04 0456) Rubella: 24.80 (02/03 1346) RPR: Non Reactive (05/31 0952)  HBsAg: Negative (02/03 1346)  HIV: Non Reactive (05/31 0952)  GBS: Negative/-- (07/25 1319)  2 hr Glucola passed Genetic screening  NIPS: low risk female  Anatomy UKoreanml  Prenatal Transfer Tool  Maternal Diabetes: No Genetic Screening: Normal Maternal Ultrasounds/Referrals: Normal Fetal Ultrasounds or other Referrals:  None Maternal Substance Abuse:  No Significant Maternal Medications:  None Significant Maternal Lab Results: Group B Strep negative  Results for orders placed or performed during the hospital encounter of 07/04/21 (from the past 24 hour(Brianna))  Resp Panel by RT-PCR (Flu A&B, Covid) Nasopharyngeal Swab   Collection Time: 07/04/21  4:47 AM   Specimen: Nasopharyngeal Swab; Nasopharyngeal(NP) swabs in vial transport medium  Result Value Ref Range   SARS Coronavirus 2 by RT PCR NEGATIVE NEGATIVE   Influenza A by PCR NEGATIVE NEGATIVE   Influenza B by PCR NEGATIVE NEGATIVE  Type and screen [facility]  Collection Time: 07/04/21  4:56 AM  Result Value Ref Range   ABO/RH(D) PENDING    Antibody Screen PENDING    Sample Expiration      07/07/2021,2359 [facility] [address] [place] [address]    Patient Active Problem List   Diagnosis Date Noted   Indication for care in labor or delivery 07/04/2021   Normal labor 07/04/2021   Supervision of normal pregnancy, antepartum 11/27/2020   Vitamin D insufficiency 01/12/2020   Language barrier 12/04/2016   History of spontaneous abortion 07/11/2015    Assessment/Plan:  SKathy Wahidis a 30y.o. G3P1011 at 342w5dere for labor  #Labor:Pt is laboring on her own.  Continue expectant management. May AROM if needed after epidural is placed.  #Pain: PRN #FWB: Cat 1 #ID: GBS neg #MOF: bottle #MOC: Paragard IUD OP #Circ:  UnKrystal ClarkMD  07/04/2021, 5:51 AM  CNM attestation:  I have seen and examined this patient; I agree with above documentation in the resident'Brianna note.   SoLener Haley a [age].o. G3P1011 here for SOL/active labor  PE: BP (!) 102/57   Pulse 96   Temp 98.3 F (36.8 C) (Oral)   Resp 18   Ht 5' 4" (1.626 m)   Wt 64.4 kg   LMP 10/13/2020  SpO2 100%   BMI 24.37 kg/m  Gen: breathing w ctx, coping well Resp: normal effort, no distress Abd: gravid  ROS, labs, PMH reviewed  Plan: Admit to L&D Expectant management for now; augment if needed Anticipate vag del  Myrtis Ser CNM 07/04/2021, 9:51 AM

## 2021-07-04 NOTE — ED Triage Notes (Signed)
Pt is 37 weeks G2P1, contractions started two hours ago, every 5 minutes, no rupture of membranes

## 2021-07-04 NOTE — Anesthesia Preprocedure Evaluation (Signed)
Anesthesia Evaluation  Patient identified by MRN, date of birth, ID band Patient awake    Reviewed: Allergy & Precautions, H&P , Patient's Chart, lab work & pertinent test results  Airway Mallampati: I       Dental no notable dental hx.    Pulmonary neg pulmonary ROS,    Pulmonary exam normal        Cardiovascular Normal cardiovascular exam     Neuro/Psych negative neurological ROS  negative psych ROS   GI/Hepatic Neg liver ROS,   Endo/Other  negative endocrine ROS  Renal/GU negative Renal ROS  negative genitourinary   Musculoskeletal negative musculoskeletal ROS (+)   Abdominal Normal abdominal exam  (+)   Peds  Hematology negative hematology ROS (+)   Anesthesia Other Findings   Reproductive/Obstetrics (+) Pregnancy                             Anesthesia Physical Anesthesia Plan  ASA: 2  Anesthesia Plan: Epidural   Post-op Pain Management:    Induction:   PONV Risk Score and Plan:   Airway Management Planned:   Additional Equipment:   Intra-op Plan:   Post-operative Plan:   Informed Consent: I have reviewed the patients History and Physical, chart, labs and discussed the procedure including the risks, benefits and alternatives for the proposed anesthesia with the patient or authorized representative who has indicated his/her understanding and acceptance.       Plan Discussed with:   Anesthesia Plan Comments:         Anesthesia Quick Evaluation

## 2021-07-05 LAB — TYPE AND SCREEN
ABO/RH(D): AB POS
Antibody Screen: NEGATIVE

## 2021-07-05 NOTE — Progress Notes (Signed)
Interpreter on video for 20 minutes. Parents had a few questions. Plan of care explained to them, emergency button, bulb suction, and color changes explained to parents. FOB showed how to bulb infant. Infant extremely spitty. Mom told all the pain meds she can have and to ask for extra if needed. Dad is ordering food. Mom refused Pain meds at this time. Mom had no complaints at this time. Parents told to call for nurse if they had questions they did not understand and we can get interpreter on the line. DEBP set up and explained per mom's request.

## 2021-07-05 NOTE — Progress Notes (Signed)
Post Partum Day 1  Subjective: No complaints, up ad lib, voiding, tolerating PO, and + flatus. Bleeding is like a period. Pain is moderately controlled.  Objective: Blood pressure 105/63, pulse 75, temperature 98.1 F (36.7 C), temperature source Oral, resp. rate 18, height 5\' 4"  (1.626 m), weight 2.985 kg, last menstrual period 10/13/2020, SpO2 100 %.  Physical Exam:  General: alert, cooperative, and no distress Lochia: appropriate Uterine Fundus: firm DVT Evaluation: no LE edema, no calf tenderness  Recent Labs    07/04/21 0455  HGB 12.0  HCT 37.9    Assessment/Plan: Brianna Haley is a 30 year old G3P1011 s/o SVD at [redacted]w[redacted]d.  VSS. Exam benign. Continue routine postpartum care. Feeding: Bottle Contraception: Outpatient IUD Dispo: Plan for discharge PPD#2   LOS: 1 day   [redacted]w[redacted]d 07/05/2021, 12:02 PM

## 2021-07-05 NOTE — Lactation Note (Signed)
This note was copied from a baby's chart. Lactation Consultation Note  Patient Name: Brianna Haley [id number] Date: 07/05/2021 Reason for consult: Initial assessment;Early term 37-38.6wks Age:29 hours  Mom resting but woke up when LC came into rm. Mom has just been formula feeding baby. Asked mom if she wants to BF as well. Mom stated she but he wont take it. Baby sleeping soundly. Asked mom to call for next feeding. Mom stated ok.  Maternal Data    Feeding    LATCH Score                    Lactation Tools Discussed/Used    Interventions    Discharge    Consult Status Consult Status: Follow-up Date: 07/05/21 Follow-up type: In-patient    Charyl Dancer 07/05/2021, 3:53 AM

## 2021-07-05 NOTE — Lactation Note (Signed)
This note was copied from a baby's chart. Lactation Consultation Note  Patient Name: Brianna Haley [id number] Date: 07/05/2021 Reason for consult: Follow-up assessment;Early term 37-38.6wks Age:30 years Interpreter used # [phone] [place] Per dad, infant was given 30 mls of Similac total care at 1900, infant was alert but mom plans to call for [facility] to assist with next latch, [facility] written name on white board. Mom started using the DEBP today and she has pumped three times, starting to see a few drops of colostrum which she is finger feeding to infant. Mom knows to offer infant any EBM that is pumped first and then formula. Mom understand to feeding infant according to cues, 8 to 12+ or more times within 24 hours.  Mom's plan: 1- Ask for latch assistance with next feeding. 2- Day two of life if infant latches at breast mom will supplement with 7-15 mls of EBM and or formula, if infant doesn't latch  continue to offer 30 mls of EBM/ and or formula per feeding to infant.  3- Mom will continue to use DEBP every 3 hours for 15 minutes on initial setting. Maternal Data    Feeding Mother's Current Feeding Choice: Breast Milk and Formula Nipple Type: Slow - flow  LATCH Score                    Lactation Tools Discussed/Used Tools: Pump Breast pump type: Double-Electric Breast Pump Pump Education: Setup, frequency, and cleaning;Milk Storage Reason for Pumping: Per mom, infant has not been latching at breast, she started using DEBP today and has pumped 3 times. Pumping frequency: Mom will pump every 3 hours for 15 minutes on inital setting.  Interventions    Discharge    Consult Status Consult Status: Follow-up Date: 07/06/21 Follow-up type: In-patient    Danelle Earthly 07/05/2021, 9:19 PM

## 2021-07-05 NOTE — Anesthesia Postprocedure Evaluation (Signed)
Anesthesia Post Note  Patient: Brianna Haley  Procedure(s) Performed: AN AD HOC LABOR EPIDURAL     Patient location during evaluation: Mother Baby Anesthesia Type: Epidural Level of consciousness: awake and alert, oriented and patient cooperative Pain management: pain level controlled Vital Signs Assessment: post-procedure vital signs reviewed and stable Respiratory status: spontaneous breathing Cardiovascular status: stable Postop Assessment: no headache, epidural receding, patient able to bend at knees and no signs of nausea or vomiting Anesthetic complications: no Comments: Pt. States she is walking.  Pain score 8 with walking much better when resting.  Pt. Encouraged to communicate pain needs with bedside RN. Online interpreter used for interview.    No notable events documented.  Last Vitals:  Vitals:   07/04/21 2358 07/05/21 0517  BP: (!) 116/53 105/63  Pulse: 78 75  Resp: 16 18  Temp: 36.8 C 36.7 C  SpO2: 100% 100%    Last Pain:  Vitals:   07/05/21 1044  TempSrc:   PainSc: 2    Pain Goal: Patients Stated Pain Goal: 0 (07/04/21 0426)              Epidural/Spinal Function Cutaneous sensation: Normal sensation (07/05/21 1044)  [facility]

## 2021-07-05 NOTE — Lactation Note (Signed)
This note was copied from a baby's chart. Lactation Consultation Note  Patient Name: Boy Breeana Sawtelle [id number] Date: 07/05/2021 Reason for consult: Follow-up assessment;Early term 37-38.6wks;1st time breastfeeding;[place] Age:29 hours   P1 mother whose infant is now 16 hours old.  This is an ETI at 37+5 weeks.  Mother's feeding preference is breast/formula.  [name] interpreter, Dany,  [phone]) used for interpretation.  Mother has been primarily formula feeding at this time.  She desires to breast feed but reported that she has "no milk."  She also has a DEBP at bedside and needs review.  Mother prefers to eat her lunch at this time, however, I asked her to call her RN to page me when she is ready to begin pumping.  Mother verbalized understanding.  RN updated.   Maternal Data    Feeding Mother's Current Feeding Choice: Breast Milk and Formula  LATCH Score                    Lactation Tools Discussed/Used    Interventions Interventions: DEBP  Discharge    Consult Status Consult Status: Follow-up Date: 07/05/21 Follow-up type: In-patient    Shyloh Derosa R Lauri Till 07/05/2021, 1:09 PM

## 2021-07-06 MED ORDER — ACETAMINOPHEN 325 MG PO TABS: 650.0000 mg | ORAL_TABLET | ORAL | 0 refills | Status: AC | PRN

## 2021-07-06 MED ORDER — IBUPROFEN 600 MG PO TABS: 600.0000 mg | ORAL_TABLET | Freq: Four times a day (QID) | ORAL | 0 refills | Status: DC

## 2021-07-06 NOTE — Progress Notes (Signed)
Discharge education completed using interpreter [phone].  Patient voiced understanding and all questions were answered.   Brianna Haley, [place]

## 2021-07-08 ENCOUNTER — Telehealth: Payer: Self-pay

## 2021-07-08 ENCOUNTER — Encounter: Payer: Medicaid Other | Admitting: Obstetrics

## 2021-07-08 NOTE — Telephone Encounter (Signed)
Transition Care Management Follow-up Telephone Call Date of discharge and from where: 07/06/2021-[facility] How have you been since you were released from the hospital? Patient stated she is doing fine.  Any questions or concerns? No  Items Reviewed: Did the pt receive and understand the discharge instructions provided? Yes  Medications obtained and verified? Yes  Other? No  Any new allergies since your discharge? No  Dietary orders reviewed? N/A Do you have support at home? Yes   Home Care and Equipment/Supplies: Were home health services ordered? not applicable If so, what is the name of the agency? N/A  Has the agency set up a time to come to the patient's home? not applicable Were any new equipment or medical supplies ordered?  No What is the name of the medical supply agency? N/A Were you able to get the supplies/equipment? not applicable Do you have any questions related to the use of the equipment or supplies? No  Functional Questionnaire: (I = Independent and D = Dependent) ADLs: I  Bathing/Dressing- I  Meal Prep- I  Eating- I  Maintaining continence- I  Transferring/Ambulation- I  Managing Meds- I  Follow up appointments reviewed:  PCP Hospital f/u appt confirmed? No   Specialist Hospital f/u appt confirmed? Yes  Scheduled to see Barbaraann Boys on 08/15/2021 @ 10:15 am. Are transportation arrangements needed? No  If their condition worsens, is the pt aware to call PCP or go to the Emergency Dept.? Yes Was the patient provided with contact information for the PCP's office or ED? Yes Was to pt encouraged to call back with questions or concerns? Yes

## 2021-07-12 ENCOUNTER — Inpatient Hospital Stay (HOSPITAL_COMMUNITY)
Admission: EM | Admit: 2021-07-12 | Discharge: 2021-07-14 | DRG: 776 | Disposition: A | Discharge status: Home / Self Care | Payer: Medicaid Other | Attending: Obstetrics & Gynecology | Admitting: Obstetrics & Gynecology

## 2021-07-12 ENCOUNTER — Other Ambulatory Visit: Payer: Self-pay

## 2021-07-12 ENCOUNTER — Encounter (HOSPITAL_COMMUNITY): Payer: Self-pay | Admitting: Emergency Medicine

## 2021-07-12 DIAGNOSIS — R1030 Lower abdominal pain, unspecified: Secondary | ICD-10-CM

## 2021-07-12 DIAGNOSIS — R000 Tachycardia, unspecified: Secondary | ICD-10-CM | POA: Diagnosis not present

## 2021-07-12 DIAGNOSIS — N12 Tubulo-interstitial nephritis, not specified as acute or chronic: Secondary | ICD-10-CM | POA: Diagnosis present

## 2021-07-12 DIAGNOSIS — O864 Pyrexia of unknown origin following delivery: Secondary | ICD-10-CM | POA: Diagnosis present

## 2021-07-12 DIAGNOSIS — Z20822 Contact with and (suspected) exposure to covid-19: Secondary | ICD-10-CM | POA: Diagnosis present

## 2021-07-12 DIAGNOSIS — R509 Fever, unspecified: Secondary | ICD-10-CM | POA: Diagnosis not present

## 2021-07-12 DIAGNOSIS — O8621 Infection of kidney following delivery: Principal | ICD-10-CM | POA: Diagnosis present

## 2021-07-12 DIAGNOSIS — B9689 Other specified bacterial agents as the cause of diseases classified elsewhere: Secondary | ICD-10-CM | POA: Diagnosis present

## 2021-07-12 DIAGNOSIS — R109 Unspecified abdominal pain: Secondary | ICD-10-CM

## 2021-07-12 LAB — URINALYSIS, ROUTINE W REFLEX MICROSCOPIC
Bilirubin Urine: NEGATIVE
Glucose, UA: NEGATIVE mg/dL
Ketones, ur: NEGATIVE mg/dL
Nitrite: NEGATIVE
Protein, ur: NEGATIVE mg/dL
Specific Gravity, Urine: 1.005 (ref 1.005–1.030)
pH: 7 (ref 5.0–8.0)

## 2021-07-12 LAB — CBC
HCT: 38.3 % (ref 36.0–46.0)
Hemoglobin: 12.4 g/dL (ref 12.0–15.0)
MCH: 28.1 pg (ref 26.0–34.0)
MCHC: 32.4 g/dL (ref 30.0–36.0)
MCV: 86.7 fL (ref 80.0–100.0)
Platelets: 358 10*3/uL (ref 150–400)
RBC: 4.42 MIL/uL (ref 3.87–5.11)
RDW: 13.3 % (ref 11.5–15.5)
WBC: 16.6 10*3/uL — ABNORMAL HIGH (ref 4.0–10.5)
nRBC: 0 % (ref 0.0–0.2)

## 2021-07-12 LAB — RESP PANEL BY RT-PCR (FLU A&B, COVID) ARPGX2
Influenza A by PCR: NEGATIVE
Influenza B by PCR: NEGATIVE
SARS Coronavirus 2 by RT PCR: NEGATIVE

## 2021-07-12 LAB — LACTIC ACID, PLASMA: Lactic Acid, Venous: 0.8 mmol/L (ref 0.5–1.9)

## 2021-07-12 MED ORDER — KETOROLAC TROMETHAMINE 30 MG/ML IJ SOLN: 30.0000 mg | Freq: Four times a day (QID) | INTRAMUSCULAR | Status: DC

## 2021-07-12 MED ORDER — BISACODYL 5 MG PO TBEC
5.0000 mg | DELAYED_RELEASE_TABLET | Freq: Every day | ORAL | Status: DC | PRN
  Administered 2021-07-14: 5 mg via ORAL
  Filled 2021-07-12: qty 1

## 2021-07-12 MED ORDER — IBUPROFEN 600 MG PO TABS
600.0000 mg | ORAL_TABLET | Freq: Once | ORAL | Status: AC
  Administered 2021-07-12: 600 mg via ORAL
  Filled 2021-07-12: qty 1

## 2021-07-12 MED ORDER — ONDANSETRON 4 MG PO TBDP
8.0000 mg | ORAL_TABLET | Freq: Once | ORAL | Status: AC
  Administered 2021-07-12: 8 mg via ORAL
  Filled 2021-07-12: qty 2

## 2021-07-12 MED ORDER — SODIUM CHLORIDE 0.9 % IV SOLN
INTRAVENOUS | Status: DC
  Administered 2021-07-13: 100 mL/h via INTRAVENOUS

## 2021-07-12 MED ORDER — ZOLPIDEM TARTRATE 5 MG PO TABS: 5.0000 mg | ORAL_TABLET | Freq: Every evening | ORAL | Status: DC | PRN

## 2021-07-12 MED ORDER — DOCUSATE SODIUM 100 MG PO CAPS
100.0000 mg | ORAL_CAPSULE | Freq: Two times a day (BID) | ORAL | Status: DC
  Administered 2021-07-12 – 2021-07-14 (×5): 100 mg via ORAL
  Filled 2021-07-12 (×5): qty 1

## 2021-07-12 MED ORDER — SODIUM CHLORIDE 0.9 % IV SOLN: Freq: Once | INTRAVENOUS | Status: AC

## 2021-07-12 MED ORDER — SODIUM CHLORIDE 0.9 % IV SOLN
2.0000 g | Freq: Once | INTRAVENOUS | Status: AC
  Administered 2021-07-13: 2 g via INTRAVENOUS
  Filled 2021-07-12: qty 20

## 2021-07-12 MED ORDER — IBUPROFEN 600 MG PO TABS
600.0000 mg | ORAL_TABLET | Freq: Four times a day (QID) | ORAL | Status: DC | PRN
  Administered 2021-07-12 – 2021-07-13 (×2): 600 mg via ORAL
  Filled 2021-07-12 (×2): qty 1

## 2021-07-12 MED ORDER — MAGNESIUM HYDROXIDE 400 MG/5ML PO SUSP: 30.0000 mL | Freq: Every day | ORAL | Status: DC | PRN

## 2021-07-12 MED ORDER — SODIUM CHLORIDE 0.9 % IV SOLN
2.0000 g | Freq: Once | INTRAVENOUS | Status: AC
  Administered 2021-07-12: 2 g via INTRAVENOUS
  Filled 2021-07-12: qty 20

## 2021-07-12 MED ORDER — HYDROMORPHONE HCL 1 MG/ML IJ SOLN: 0.2000 mg | INTRAMUSCULAR | Status: DC | PRN

## 2021-07-12 MED ORDER — ONDANSETRON HCL 4 MG PO TABS
4.0000 mg | ORAL_TABLET | Freq: Four times a day (QID) | ORAL | Status: DC | PRN
  Administered 2021-07-12: 4 mg via ORAL
  Filled 2021-07-12: qty 1

## 2021-07-12 MED ORDER — SODIUM CHLORIDE 0.9 % IV SOLN: Freq: Once | INTRAVENOUS | Status: DC

## 2021-07-12 MED ORDER — KETOROLAC TROMETHAMINE 30 MG/ML IJ SOLN
30.0000 mg | Freq: Four times a day (QID) | INTRAMUSCULAR | Status: DC | PRN
  Administered 2021-07-12: 30 mg via INTRAVENOUS
  Filled 2021-07-12: qty 1

## 2021-07-12 MED ORDER — ONDANSETRON HCL 4 MG/2ML IJ SOLN: 4.0000 mg | Freq: Four times a day (QID) | INTRAMUSCULAR | Status: DC | PRN

## 2021-07-12 MED ORDER — CEFDINIR 300 MG PO CAPS: 300.0000 mg | ORAL_CAPSULE | Freq: Two times a day (BID) | ORAL | 0 refills | Status: DC

## 2021-07-12 MED ORDER — OXYCODONE-ACETAMINOPHEN 5-325 MG PO TABS
1.0000 | ORAL_TABLET | ORAL | Status: DC | PRN
  Administered 2021-07-12 – 2021-07-13 (×2): 1 via ORAL
  Filled 2021-07-12 (×2): qty 1

## 2021-07-12 MED ORDER — ALUM & MAG HYDROXIDE-SIMETH 200-200-20 MG/5ML PO SUSP: 30.0000 mL | ORAL | Status: DC | PRN

## 2021-07-12 MED ORDER — ONDANSETRON 8 MG PO TBDP: 8.0000 mg | ORAL_TABLET | Freq: Three times a day (TID) | ORAL | 0 refills | Status: DC | PRN

## 2021-07-12 MED ORDER — ACETAMINOPHEN 325 MG PO TABS
650.0000 mg | ORAL_TABLET | Freq: Once | ORAL | Status: AC
  Administered 2021-07-12: 650 mg via ORAL
  Filled 2021-07-12: qty 2

## 2021-07-12 NOTE — ED Notes (Signed)
Pt transported to MAU  

## 2021-07-12 NOTE — MAU Provider Note (Signed)
Chief Complaint:  Fever   Event Date/Time   First Provider Initiated Contact with Patient 07/12/21 0252     HPI: Brianna Haley is a 30 y.o. G3P1011 who is 8 days postpartum presents to maternity admissions reporting fever/chills, right flank pain, pelvic pain, and intermittent vomiting.  Only breastfed for 3 days then stopped.  No engorgement.  Marland KitchenSVD 07/04/21 She reports vaginal bleeding, vaginal itching/burning, urinary symptoms, h/a, dizziness, n/v, or fever/chills.    Fever  This is a new problem. The current episode started yesterday. The problem occurs constantly. The problem has been unchanged. The maximum temperature noted was 101 to 101.9 F. The temperature was taken using an oral thermometer. Associated symptoms include abdominal pain, muscle aches, nausea and vomiting. Pertinent negatives include no chest pain, congestion, coughing, diarrhea, headaches or wheezing. She has tried nothing for the symptoms.   RN Note: Last night I started having headache, lower abd pain and R back pain with chills and vomiting. SVD 07/04/21 with no problems. Pt is bottlefeeding. Back pain is worse with movement. Has not taken any meds for pain  Past Medical History: Past Medical History:  Diagnosis Date   Abdominal pain    Body aches    Chest discomfort    Constipation    Cough    Fatigue    Feeling of sadness    Fever    GERD (gastroesophageal reflux disease)    Helicobacter pylori antibody positive    History of spontaneous abortion    Language barrier, cultural differences    Malaise    Maternal varicella, non-immune    RBBB    Sinusitis chronic, frontal    SOB (shortness of breath)     Past obstetric history: OB History  Gravida Para Term Preterm AB Living  '3 1 1 ' 0 1 1  SAB IAB Ectopic Multiple Live Births  1 0 0 0 1    # Outcome Date GA Lbr Len/2nd Weight Sex Delivery Anes PTL Lv  3 Gravida           2 Term 05/04/17    M Vag-Spont EPI  LIV  1 SAB             Past Surgical  History: History reviewed. No pertinent surgical history.  Family History: Family History  Problem Relation Age of Onset   Healthy Mother    Healthy Father     Social History: Social History   Tobacco Use   Smoking status: Never   Smokeless tobacco: Never  Vaping Use   Vaping Use: Never used  Substance Use Topics   Alcohol use: No   Drug use: No    Allergies: No Known Allergies  Meds:  Medications Prior to Admission  Medication Sig Dispense Refill Last Dose   acetaminophen (TYLENOL) 325 MG tablet Take 2 tablets (650 mg total) by mouth every 4 (four) hours as needed (for pain scale < 4). 240 tablet 0    Blood Pressure Monitor KIT 1 Device by Does not apply route once a week. To be monitored Regularly at home. 1 kit 0    docusate sodium (COLACE) 100 MG capsule Take 1 capsule (100 mg total) by mouth 2 (two) times daily. 10 capsule 0    ibuprofen (ADVIL) 600 MG tablet Take 1 tablet (600 mg total) by mouth every 6 (six) hours. 30 tablet 0    polyethylene glycol powder (GLYCOLAX/MIRALAX) 17 GM/SCOOP powder Take 17 g by mouth daily. 255 g 0    Prenat-FeCbn-FeAsp-Meth-FA-DHA (  PRENATE MINI) 18-0.6-0.4-350 MG CAPS Take 1 capsule by mouth daily before breakfast. 90 capsule 4     I have reviewed patient's Past Medical Hx, Surgical Hx, Family Hx, Social Hx, medications and allergies.  ROS:  Review of Systems  Constitutional:  Positive for fever.  HENT:  Negative for congestion.   Respiratory:  Negative for cough and wheezing.   Cardiovascular:  Negative for chest pain.  Gastrointestinal:  Positive for abdominal pain, nausea and vomiting. Negative for diarrhea.  Neurological:  Negative for headaches.  Other systems negative     Physical Exam  Patient Vitals for the past 24 hrs:  BP Temp Temp src Pulse Resp SpO2 Height Weight  07/12/21 0205 103/63 (!) 100.8 F (38.2 C) -- (!) 130 18 98 % '5\' 4"'  (1.626 m) 58.5 kg  07/12/21 0123 109/72 (!) 101.6 F (38.7 C) Oral (!) 131 (!) 24  100 % -- --   Constitutional: Well-developed, well-nourished female in no acute distress.  Ill-appearing  Cardiovascular: normal rate and rhythm, no ectopy audible, S1 & S2 heard, no murmur Respiratory: normal effort, no distress. Lungs CTAB with no wheezes or crackles GI: Abd soft, non-tender.  Nondistended.  No rebound, No guarding.  Bowel Sounds audible  MS: Extremities nontender, no edema, normal ROM Neurologic: Alert and oriented x 4.   Grossly nonfocal. GU: Neg CVAT on left, significant CVAT on right. Skin:  Warm and Dry Psych:  Affect appropriate.  PELVIC EXAM: Lochia small uterus mildly tender, nonenlarged, adnexa without tenderness, enlargement, or mass     Labs: Results for orders placed or performed during the hospital encounter of 07/12/21 (from the past 24 hour(s))  Urinalysis, Routine w reflex microscopic Urine, Clean Catch     Status: Abnormal   Collection Time: 07/12/21  2:33 AM  Result Value Ref Range   Color, Urine STRAW (A) YELLOW   APPearance CLEAR CLEAR   Specific Gravity, Urine 1.005 1.005 - 1.030   pH 7.0 5.0 - 8.0   Glucose, UA NEGATIVE NEGATIVE mg/dL   Hgb urine dipstick MODERATE (A) NEGATIVE   Bilirubin Urine NEGATIVE NEGATIVE   Ketones, ur NEGATIVE NEGATIVE mg/dL   Protein, ur NEGATIVE NEGATIVE mg/dL   Nitrite NEGATIVE NEGATIVE   Leukocytes,Ua LARGE (A) NEGATIVE   RBC / HPF 0-5 0 - 5 RBC/hpf   WBC, UA 21-50 0 - 5 WBC/hpf   Bacteria, UA MANY (A) NONE SEEN   Squamous Epithelial / LPF 0-5 0 - 5   Mucus PRESENT   CBC     Status: Abnormal   Collection Time: 07/12/21  2:53 AM  Result Value Ref Range   WBC 16.6 (H) 4.0 - 10.5 K/uL   RBC 4.42 3.87 - 5.11 MIL/uL   Hemoglobin 12.4 12.0 - 15.0 g/dL   HCT 38.3 36.0 - 46.0 %   MCV 86.7 80.0 - 100.0 fL   MCH 28.1 26.0 - 34.0 pg   MCHC 32.4 30.0 - 36.0 g/dL   RDW 13.3 11.5 - 15.5 %   Platelets 358 150 - 400 K/uL   nRBC 0.0 0.0 - 0.2 %   --/--/AB POS (08/04 0456)  Imaging:  No results found.  MAU  Course/MDM: I have ordered labs as follows: There is leukocytosis.  Hgb normal UA suggestive of UTI  Blood cultures and lactic acid. Imaging ordered:  Results reviewed.   Consult Dr Nelda Marseille.   Patient received meds and had gotten down to 99 but as we were preparing to send her home, she  spiked another fever to 103.Marland Kitchen Decision made to admit for IV antibiotics until 48 hrs afebrile.   Treatments in MAU included IV hydration, Tylenol, Rocephin 2gm.     Assessment: Postpartum Day #8 Fever Flank pain Pelvic pain Likely Pyelonephritis  Plan: Admit for IV antibiotics Routine orders per Dr Nelda Marseille MD to follow  Hansel Feinstein CNM, MSN Certified Nurse-Midwife 07/12/2021 2:52 AM

## 2021-07-12 NOTE — ED Provider Notes (Signed)
Emergency Medicine Provider OB Triage Evaluation Note  Brianna Haley is a 30 y.o. female, status post 1 week vaginal delivery presenting with abdominal pain and fever.  She complains of right flank pain.  She complains of associated chills.  Symptoms are worsened with palpation.    Review of  Systems  Positive: abdominal pain, fever Negative: vomiting  Physical Exam  BP 109/72 (BP Location: Left Arm)   Pulse (!) 131   Temp (!) 101.6 F (38.7 C) (Oral)   Resp (!) 24   LMP 10/13/2020   SpO2 100%   Breastfeeding Unknown  General: Awake, no distress  HEENT: Atraumatic  Resp: Normal effort  Cardiac: Normal rate Abd: Nondistended, nontender  MSK: Moves all extremities without difficulty Neuro: Speech clear  Medical Decision Making  Pt evaluated for pregnancy concern and is stable for transfer to MAU. Pt is in agreement with plan for transfer.  1:29 AM Discussed with MAU APP, Hilda Lias, who accepts patient in transfer.  Clinical Impression   1. Lower abdominal pain        Roxy Horseman, PA-C 07/12/21 0130    Geoffery Lyons, MD 07/12/21 [phone]

## 2021-07-12 NOTE — ED Triage Notes (Signed)
Pt c/o lower abdominal pain, right flank pain and chills. 1 week post partum.

## 2021-07-12 NOTE — MAU Note (Addendum)
Last night I started having headache, lower abd pain and R back pain with chills and vomiting. SVD 07/04/21 with no problems. Pt is bottlefeeding. Back pain is worse with movement. Has not taken any meds for pain

## 2021-07-12 NOTE — H&P (Signed)
Chief Complaint:  Fever   Event Date/Time   First Provider Initiated Contact with Patient 07/12/21 0252     HPI: Brianna Haley is a 30 y.o. G3P1011 who is 8 days postpartum presents to maternity admissions reporting fever/chills, right flank pain, pelvic pain, and intermittent vomiting.  Only breastfed for 3 days then stopped.  No engorgement.  Marland KitchenSVD 07/04/21 She reports vaginal bleeding, vaginal itching/burning, urinary symptoms, h/a, dizziness, n/v, or fever/chills.    Fever  This is a new problem. The current episode started yesterday. The problem occurs constantly. The problem has been unchanged. The maximum temperature noted was 101 to 101.9 F. The temperature was taken using an oral thermometer. Associated symptoms include abdominal pain, muscle aches, nausea and vomiting. Pertinent negatives include no chest pain, congestion, coughing, diarrhea, headaches or wheezing. She has tried nothing for the symptoms.   RN Note: Last night I started having headache, lower abd pain and R back pain with chills and vomiting. SVD 07/04/21 with no problems. Pt is bottlefeeding. Back pain is worse with movement. Has not taken any meds for pain  Past Medical History: Past Medical History:  Diagnosis Date   Abdominal pain    Body aches    Chest discomfort    Constipation    Cough    Fatigue    Feeling of sadness    Fever    GERD (gastroesophageal reflux disease)    Helicobacter pylori antibody positive    History of spontaneous abortion    Language barrier, cultural differences    Malaise    Maternal varicella, non-immune    RBBB    Sinusitis chronic, frontal    SOB (shortness of breath)     Past obstetric history: OB History  Gravida Para Term Preterm AB Living  '3 1 1 ' 0 1 1  SAB IAB Ectopic Multiple Live Births  1 0 0 0 1    # Outcome Date GA Lbr Len/2nd Weight Sex Delivery Anes PTL Lv  3 Gravida           2 Term 05/04/17    M Vag-Spont EPI  LIV  1 SAB             Past Surgical  History: History reviewed. No pertinent surgical history.  Family History: Family History  Problem Relation Age of Onset   Healthy Mother    Healthy Father     Social History: Social History   Tobacco Use   Smoking status: Never   Smokeless tobacco: Never  Vaping Use   Vaping Use: Never used  Substance Use Topics   Alcohol use: No   Drug use: No    Allergies: No Known Allergies  Meds:  Medications Prior to Admission  Medication Sig Dispense Refill Last Dose   acetaminophen (TYLENOL) 325 MG tablet Take 2 tablets (650 mg total) by mouth every 4 (four) hours as needed (for pain scale < 4). 240 tablet 0    Blood Pressure Monitor KIT 1 Device by Does not apply route once a week. To be monitored Regularly at home. 1 kit 0    docusate sodium (COLACE) 100 MG capsule Take 1 capsule (100 mg total) by mouth 2 (two) times daily. 10 capsule 0    ibuprofen (ADVIL) 600 MG tablet Take 1 tablet (600 mg total) by mouth every 6 (six) hours. 30 tablet 0    polyethylene glycol powder (GLYCOLAX/MIRALAX) 17 GM/SCOOP powder Take 17 g by mouth daily. 255 g 0    Prenat-FeCbn-FeAsp-Meth-FA-DHA (  PRENATE MINI) 18-0.6-0.4-350 MG CAPS Take 1 capsule by mouth daily before breakfast. 90 capsule 4     I have reviewed patient's Past Medical Hx, Surgical Hx, Family Hx, Social Hx, medications and allergies.  ROS:  Review of Systems  Constitutional:  Positive for fever.  HENT:  Negative for congestion.   Respiratory:  Negative for cough and wheezing.   Cardiovascular:  Negative for chest pain.  Gastrointestinal:  Positive for abdominal pain, nausea and vomiting. Negative for diarrhea.  Neurological:  Negative for headaches.  Other systems negative     Physical Exam  Patient Vitals for the past 24 hrs:  BP Temp Temp src Pulse Resp SpO2 Height Weight  07/12/21 0205 103/63 (!) 100.8 F (38.2 C) -- (!) 130 18 98 % '5\' 4"'  (1.626 m) 58.5 kg  07/12/21 0123 109/72 (!) 101.6 F (38.7 C) Oral (!) 131 (!) 24  100 % -- --   Constitutional: Well-developed, well-nourished female in no acute distress.  Ill-appearing  Cardiovascular: normal rate and rhythm, no ectopy audible, S1 & S2 heard, no murmur Respiratory: normal effort, no distress. Lungs CTAB with no wheezes or crackles GI: Abd soft, non-tender.  Nondistended.  No rebound, No guarding.  Bowel Sounds audible  MS: Extremities nontender, no edema, normal ROM Neurologic: Alert and oriented x 4.   Grossly nonfocal. GU: Neg CVAT on left, significant CVAT on right. Skin:  Warm and Dry Psych:  Affect appropriate.  PELVIC EXAM: Lochia small uterus mildly tender, nonenlarged, adnexa without tenderness, enlargement, or mass     Labs: Results for orders placed or performed during the hospital encounter of 07/12/21 (from the past 24 hour(s))  Urinalysis, Routine w reflex microscopic Urine, Clean Catch     Status: Abnormal   Collection Time: 07/12/21  2:33 AM  Result Value Ref Range   Color, Urine STRAW (A) YELLOW   APPearance CLEAR CLEAR   Specific Gravity, Urine 1.005 1.005 - 1.030   pH 7.0 5.0 - 8.0   Glucose, UA NEGATIVE NEGATIVE mg/dL   Hgb urine dipstick MODERATE (A) NEGATIVE   Bilirubin Urine NEGATIVE NEGATIVE   Ketones, ur NEGATIVE NEGATIVE mg/dL   Protein, ur NEGATIVE NEGATIVE mg/dL   Nitrite NEGATIVE NEGATIVE   Leukocytes,Ua LARGE (A) NEGATIVE   RBC / HPF 0-5 0 - 5 RBC/hpf   WBC, UA 21-50 0 - 5 WBC/hpf   Bacteria, UA MANY (A) NONE SEEN   Squamous Epithelial / LPF 0-5 0 - 5   Mucus PRESENT   CBC     Status: Abnormal   Collection Time: 07/12/21  2:53 AM  Result Value Ref Range   WBC 16.6 (H) 4.0 - 10.5 K/uL   RBC 4.42 3.87 - 5.11 MIL/uL   Hemoglobin 12.4 12.0 - 15.0 g/dL   HCT 38.3 36.0 - 46.0 %   MCV 86.7 80.0 - 100.0 fL   MCH 28.1 26.0 - 34.0 pg   MCHC 32.4 30.0 - 36.0 g/dL   RDW 13.3 11.5 - 15.5 %   Platelets 358 150 - 400 K/uL   nRBC 0.0 0.0 - 0.2 %   --/--/AB POS (08/04 0456)  Imaging:  No results found.  MAU  Course/MDM: I have ordered labs as follows: There is leukocytosis.  Hgb normal UA suggestive of UTI  Blood cultures and lactic acid. Imaging ordered:  Results reviewed.   Consult Dr Nelda Marseille.   Patient received meds and had gotten down to 99 but as we were preparing to send her home, she  spiked another fever to 103.Marland Kitchen Decision made to admit for IV antibiotics until 48 hrs afebrile.   Treatments in MAU included IV hydration, Tylenol, Rocephin 2gm.     Assessment: Postpartum Day #8 Fever Flank pain Pelvic pain Likely Pyelonephritis  Plan: Admit for IV antibiotics Routine orders per Dr Nelda Marseille MD to follow  Hansel Feinstein CNM, MSN Certified Nurse-Midwife 07/12/2021 2:52 AM

## 2021-07-13 DIAGNOSIS — Z20822 Contact with and (suspected) exposure to covid-19: Secondary | ICD-10-CM | POA: Diagnosis not present

## 2021-07-13 DIAGNOSIS — O864 Pyrexia of unknown origin following delivery: Secondary | ICD-10-CM | POA: Diagnosis not present

## 2021-07-13 DIAGNOSIS — R509 Fever, unspecified: Secondary | ICD-10-CM | POA: Diagnosis not present

## 2021-07-13 DIAGNOSIS — B9689 Other specified bacterial agents as the cause of diseases classified elsewhere: Secondary | ICD-10-CM | POA: Diagnosis not present

## 2021-07-13 DIAGNOSIS — R1030 Lower abdominal pain, unspecified: Secondary | ICD-10-CM | POA: Diagnosis not present

## 2021-07-13 DIAGNOSIS — O8621 Infection of kidney following delivery: Secondary | ICD-10-CM | POA: Diagnosis not present

## 2021-07-13 DIAGNOSIS — N12 Tubulo-interstitial nephritis, not specified as acute or chronic: Secondary | ICD-10-CM | POA: Diagnosis not present

## 2021-07-13 LAB — CBC WITH DIFFERENTIAL/PLATELET
Abs Immature Granulocytes: 0.05 10*3/uL (ref 0.00–0.07)
Basophils Absolute: 0.1 10*3/uL (ref 0.0–0.1)
Basophils Relative: 1 %
Eosinophils Absolute: 0.2 10*3/uL (ref 0.0–0.5)
Eosinophils Relative: 2 %
HCT: 35.8 % — ABNORMAL LOW (ref 36.0–46.0)
Hemoglobin: 11.3 g/dL — ABNORMAL LOW (ref 12.0–15.0)
Immature Granulocytes: 1 %
Lymphocytes Relative: 19 %
Lymphs Abs: 2 10*3/uL (ref 0.7–4.0)
MCH: 28.2 pg (ref 26.0–34.0)
MCHC: 31.6 g/dL (ref 30.0–36.0)
MCV: 89.3 fL (ref 80.0–100.0)
Monocytes Absolute: 1.5 10*3/uL — ABNORMAL HIGH (ref 0.1–1.0)
Monocytes Relative: 14 %
Neutro Abs: 6.8 10*3/uL (ref 1.7–7.7)
Neutrophils Relative %: 63 %
Platelets: 294 10*3/uL (ref 150–400)
RBC: 4.01 MIL/uL (ref 3.87–5.11)
RDW: 13.6 % (ref 11.5–15.5)
WBC: 10.5 10*3/uL (ref 4.0–10.5)
nRBC: 0 % (ref 0.0–0.2)

## 2021-07-13 LAB — BASIC METABOLIC PANEL
Anion gap: 4 — ABNORMAL LOW (ref 5–15)
BUN: 12 mg/dL (ref 6–20)
CO2: 24 mmol/L (ref 22–32)
Calcium: 8.5 mg/dL — ABNORMAL LOW (ref 8.9–10.3)
Chloride: 111 mmol/L (ref 98–111)
Creatinine, Ser: 0.6 mg/dL (ref 0.44–1.00)
GFR, Estimated: >60 mL/min (ref >60)
Glucose, Bld: 84 mg/dL (ref 70–99)
Potassium: 4 mmol/L (ref 3.5–5.1)
Sodium: 139 mmol/L (ref 135–145)

## 2021-07-13 MED ORDER — SODIUM CHLORIDE 0.9% FLUSH: 3.0000 mL | INTRAVENOUS | Status: DC | PRN

## 2021-07-13 MED ORDER — SODIUM CHLORIDE 0.9 % IV SOLN: INTRAVENOUS | Status: DC | PRN

## 2021-07-13 MED ORDER — SODIUM CHLORIDE 0.9% FLUSH
3.0000 mL | Freq: Two times a day (BID) | INTRAVENOUS | Status: DC
  Administered 2021-07-13: 3 mL via INTRAVENOUS

## 2021-07-13 MED ORDER — SODIUM CHLORIDE 0.9 % IV SOLN
2.0000 g | INTRAVENOUS | Status: DC
  Administered 2021-07-14: 2 g via INTRAVENOUS
  Filled 2021-07-13: qty 20

## 2021-07-13 NOTE — Plan of Care (Signed)
  Problem: Education: Goal: Knowledge of General Education information will improve Description: Including pain rating scale, medication(s)/side effects and non-pharmacologic comfort measures Outcome: Completed/Met   Problem: Activity: Goal: Risk for activity intolerance will decrease Outcome: Completed/Met   Problem: Coping: Goal: Level of anxiety will decrease Outcome: Completed/Met   Problem: Elimination: Goal: Will not experience complications related to urinary retention Outcome: Completed/Met

## 2021-07-13 NOTE — Progress Notes (Signed)
Post Partum Day 10, readmission with pyelonephritis, presumed Subjective: up ad lib, voiding, tolerating PO, and complains of right flank pain but better than on admission  Objective: Blood pressure (!) 87/47, pulse (!) 52, temperature 97.6 F (36.4 C), temperature source Oral, resp. rate 15, height 5\' 4"  (1.626 m), weight 58.5 kg, SpO2 100 %, not currently breastfeeding.  Physical Exam:  General: alert, cooperative, and no distress Lochia: appropriate Uterine Fundus: firm Incision:  DVT Evaluation: No evidence of DVT seen on physical exam. Abdomen is soft non tender completely Results for orders placed or performed during the hospital encounter of 07/12/21 (from the past 24 hour(s))  Resp Panel by RT-PCR (Flu A&B, Covid) Nasopharyngeal Swab     Status: None   Collection Time: 07/12/21  8:22 AM   Specimen: Nasopharyngeal Swab; Nasopharyngeal(NP) swabs in vial transport medium  Result Value Ref Range   SARS Coronavirus 2 by RT PCR NEGATIVE NEGATIVE   Influenza A by PCR NEGATIVE NEGATIVE   Influenza B by PCR NEGATIVE NEGATIVE    CBC pending this am Assessment/Plan: Improved clinical status on rocephin, no fever for 18 hours Continue rocephin   LOS: 0 days   09/11/21 07/13/2021, 7:18 AM

## 2021-07-14 LAB — CULTURE, OB URINE: Culture: >=100000 — AB

## 2021-07-14 MED ORDER — CEFDINIR 300 MG PO CAPS: 300.0000 mg | ORAL_CAPSULE | Freq: Two times a day (BID) | ORAL | 0 refills | Status: AC

## 2021-07-14 MED ORDER — IBUPROFEN 600 MG PO TABS: 600.0000 mg | ORAL_TABLET | Freq: Four times a day (QID) | ORAL | 0 refills | Status: DC | PRN

## 2021-07-14 NOTE — Discharge Summary (Signed)
Physician Discharge Summary  Patient ID: Brianna Haley MRN: [id number] DOB/AGE: 03/07/1991 29 y.o.  Admit date: 07/12/2021 Discharge date: 07/14/2021  Admission Diagnoses: PP pyelonephritis with evidence of transient bactiremia  Discharge Diagnoses:  Active Problems:   Postpartum care following vaginal delivery   Pyelonephritis   Postpartum fever   Discharged Condition: good  Hospital Course: 72 hours of rocephin with resolution of fever and back pain, home on cefdinir, urine culture E coli sensitivities pending blood cultures negative thus far  Consults: ID  Significant Diagnostic Studies: labs:   Treatments: antibiotics: ceftriaxone  Discharge Exam: Blood pressure 105/65, pulse 80, temperature 98.1 F (36.7 C), resp. rate 15, height '5\' 4"'  (1.626 m), weight 58.5 kg, SpO2 100 %, not currently breastfeeding. General appearance: alert, cooperative, and no distress Back: symmetric, no curvature. ROM normal. No CVA tenderness.  Disposition: Discharge disposition: 01-Home or Self Care       Discharge Instructions     Call MD for:  persistant nausea and vomiting   Complete by: As directed    Call MD for:  severe uncontrolled pain   Complete by: As directed    Call MD for:  temperature >100.4   Complete by: As directed    Diet - low sodium heart healthy   Complete by: As directed    Increase activity slowly   Complete by: As directed       Allergies as of 07/14/2021   No Known Allergies      Medication List     TAKE these medications    acetaminophen 325 MG tablet Commonly known as: Tylenol Take 2 tablets (650 mg total) by mouth every 4 (four) hours as needed (for pain scale < 4).   Blood Pressure Monitor Kit 1 Device by Does not apply route once a week. To be monitored Regularly at home.   cefdinir 300 MG capsule Commonly known as: OMNICEF Take 1 capsule (300 mg total) by mouth every 12 (twelve) hours.   cefdinir 300 MG capsule Commonly known as:  OMNICEF Take 1 capsule (300 mg total) by mouth 2 (two) times daily for 10 days.   docusate sodium 100 MG capsule Commonly known as: Colace Take 1 capsule (100 mg total) by mouth 2 (two) times daily.   ibuprofen 600 MG tablet Commonly known as: ADVIL Take 1 tablet (600 mg total) by mouth every 6 (six) hours. What changed: Another medication with the same name was added. Make sure you understand how and when to take each.   ibuprofen 600 MG tablet Commonly known as: ADVIL Take 1 tablet (600 mg total) by mouth every 6 (six) hours as needed for moderate pain, cramping or mild pain. What changed: You were already taking a medication with the same name, and this prescription was added. Make sure you understand how and when to take each.   ondansetron 8 MG disintegrating tablet Commonly known as: ZOFRAN-ODT Take 1 tablet (8 mg total) by mouth every 8 (eight) hours as needed for nausea or vomiting.   polyethylene glycol powder 17 GM/SCOOP powder Commonly known as: GLYCOLAX/MIRALAX Take 17 g by mouth daily.   Prenate Mini 18-0.6-0.4-350 MG Caps Take 1 capsule by mouth daily before breakfast.        Follow-up Information     [facility] Follow up in 1 week(s).   Specialty: Obstetrics and Gynecology Why: Call the office to set up appt Contact information: [address] [place] [phone]  SignedFlorian Buff 07/14/2021, 7:54 AM

## 2021-07-15 ENCOUNTER — Telehealth (INDEPENDENT_AMBULATORY_CARE_PROVIDER_SITE_OTHER): Payer: Medicaid Other | Admitting: Obstetrics and Gynecology

## 2021-07-15 ENCOUNTER — Telehealth: Payer: Self-pay

## 2021-07-15 DIAGNOSIS — N12 Tubulo-interstitial nephritis, not specified as acute or chronic: Secondary | ICD-10-CM | POA: Diagnosis not present

## 2021-07-15 DIAGNOSIS — O99893 Other specified diseases and conditions complicating puerperium: Secondary | ICD-10-CM

## 2021-07-15 NOTE — Progress Notes (Signed)
Virtual Visit via Telephone Note  I connected with Brianna Haley on 07/15/21 at  8:55 AM EDT by telephone and verified that I am speaking with the correct person using two identifiers.  Used [name] [phone]  Follow up after ER visit pp pyelonephritis with evidence of transient bacteremia s/p SVD on 07/04/21. Pt denies pain. She is taking ABx's as directed.  Routine PP visit & IUD insertion scheduled 08/15/21

## 2021-07-15 NOTE — Telephone Encounter (Signed)
Transition Care Management Follow-up Telephone Call Date of discharge and from where: 07/14/2021-[facility]  How have you been since you were released from the hospital? Patient stated she is doing fine.  Any questions or concerns? No  Items Reviewed: Did the pt receive and understand the discharge instructions provided? Yes  Medications obtained and verified? Yes  Other? No  Any new allergies since your discharge? No  Dietary orders reviewed? N/A Do you have support at home? Yes   Home Care and Equipment/Supplies: Were home health services ordered? not applicable If so, what is the name of the agency? N/A  Has the agency set up a time to come to the patient's home? not applicable Were any new equipment or medical supplies ordered?  No What is the name of the medical supply agency? N/A Were you able to get the supplies/equipment? not applicable Do you have any questions related to the use of the equipment or supplies? No  Functional Questionnaire: (I = Independent and D = Dependent) ADLs: I  Bathing/Dressing- I  Meal Prep- I  Eating- I  Maintaining continence- I  Transferring/Ambulation- I  Managing Meds- I  Follow up appointments reviewed:  PCP Hospital f/u appt confirmed? No   Specialist Hospital f/u appt confirmed? Yes  Scheduled to see Dr. Donavan Foil on 07/15/2021 @ 8:55 am. Are transportation arrangements needed? No  If their condition worsens, is the pt aware to call PCP or go to the Emergency Dept.? Yes Was the patient provided with contact information for the PCP's office or ED? Yes Was to pt encouraged to call back with questions or concerns? Yes

## 2021-07-15 NOTE — Progress Notes (Signed)
TELEHEALTH GYNECOLOGY VISIT ENCOUNTER NOTE  Provider location: Center for [facility]   Patient location: Home  I connected with Brianna Haley on 07/15/21 at  8:55 AM EDT by telephone and verified that I am speaking with the correct person using two identifiers. Patient was unable to do MyChart audiovisual encounter due to technical difficulties, she tried several times.    I discussed the limitations, risks, security and privacy concerns of performing an evaluation and management service by telephone and the availability of in person appointments. I also discussed with the patient that there may be a patient responsible charge related to this service. The patient expressed understanding and agreed to proceed.   History:  Brianna Haley is a 30 y.o. [phone] female being evaluated today for surveillance of pyelonephritis postpartum.  The patient was discharged on 07/14/21.  Pt is aware of need to finish her current antibiotics.  She currently  denies fever, chill or flank pain.  She denies any abnormal vaginal discharge, bleeding, pelvic pain or other concerns.       Past Medical History:  Diagnosis Date   Abdominal pain    Body aches    Chest discomfort    Constipation    Cough    Fatigue    Feeling of sadness    Fever    GERD (gastroesophageal reflux disease)    Helicobacter pylori antibody positive    History of spontaneous abortion    Language barrier, cultural differences    Malaise    Maternal varicella, non-immune    RBBB    Sinusitis chronic, frontal    SOB (shortness of breath)    Vitamin D insufficiency 01/12/2020   No past surgical history on file. The following portions of the patient's history were reviewed and updated as appropriate: allergies, current medications, past family history, past medical history, past social history, past surgical history and problem list.   Health Maintenance:  Normal pap and negative HRHPV on 06/06/20.    Review of  Systems:  Pertinent items noted in HPI and remainder of comprehensive ROS otherwise negative.  Physical Exam:   General:  Alert, oriented and cooperative.   Mental Status: Normal mood and affect perceived. Normal judgment and thought content.  Physical exam deferred due to nature of the encounter  Labs and Imaging Results for orders placed or performed during the hospital encounter of 07/12/21 (from the past 336 hour(s))  Culture, OB Urine   Collection Time: 07/12/21  2:33 AM   Specimen: Urine, Random  Result Value Ref Range   Specimen Description URINE, RANDOM    Special Requests NONE    Culture (A)     >=100,000 COLONIES/mL ESCHERICHIA COLI NO GROUP B STREP (S.AGALACTIAE) ISOLATED Performed at [facility], [address] [place], [place] [address]    Report Status 07/14/2021 FINAL    Organism ID, Bacteria ESCHERICHIA COLI (A)       Susceptibility   Escherichia coli - MIC*    AMPICILLIN <=2 SENSITIVE Sensitive     CEFAZOLIN <=4 SENSITIVE Sensitive     CEFEPIME <=0.12 SENSITIVE Sensitive     CEFTRIAXONE <=0.25 SENSITIVE Sensitive     CIPROFLOXACIN <=0.25 SENSITIVE Sensitive     GENTAMICIN <=1 SENSITIVE Sensitive     IMIPENEM <=0.25 SENSITIVE Sensitive     NITROFURANTOIN <=16 SENSITIVE Sensitive     TRIMETH/SULFA <=20 SENSITIVE Sensitive     AMPICILLIN/SULBACTAM <=2 SENSITIVE Sensitive     PIP/TAZO <=4 SENSITIVE Sensitive     * >=  100,000 COLONIES/mL ESCHERICHIA COLI  Urinalysis, Routine w reflex microscopic Urine, Clean Catch   Collection Time: 07/12/21  2:33 AM  Result Value Ref Range   Color, Urine STRAW (A) YELLOW   APPearance CLEAR CLEAR   Specific Gravity, Urine 1.005 1.005 - 1.030   pH 7.0 5.0 - 8.0   Glucose, UA NEGATIVE NEGATIVE mg/dL   Hgb urine dipstick MODERATE (A) NEGATIVE   Bilirubin Urine NEGATIVE NEGATIVE   Ketones, ur NEGATIVE NEGATIVE mg/dL   Protein, ur NEGATIVE NEGATIVE mg/dL   Nitrite NEGATIVE NEGATIVE   Leukocytes,Ua LARGE (A) NEGATIVE    RBC / HPF 0-5 0 - 5 RBC/hpf   WBC, UA 21-50 0 - 5 WBC/hpf   Bacteria, UA MANY (A) NONE SEEN   Squamous Epithelial / LPF 0-5 0 - 5   Mucus PRESENT   CBC   Collection Time: 07/12/21  2:53 AM  Result Value Ref Range   WBC 16.6 (H) 4.0 - 10.5 K/uL   RBC 4.42 3.87 - 5.11 MIL/uL   Hemoglobin 12.4 12.0 - 15.0 g/dL   HCT 37.8 58.8 - 50.2 %   MCV 86.7 80.0 - 100.0 fL   MCH 28.1 26.0 - 34.0 pg   MCHC 32.4 30.0 - 36.0 g/dL   RDW 77.4 12.8 - 78.6 %   Platelets 358 150 - 400 K/uL   nRBC 0.0 0.0 - 0.2 %  Culture, blood (routine x 2)   Collection Time: 07/12/21  5:12 AM   Specimen: BLOOD  Result Value Ref Range   Specimen Description BLOOD LEFT ANTECUBITAL    Special Requests      BOTTLES DRAWN AEROBIC AND ANAEROBIC Blood Culture adequate volume   Culture      NO GROWTH 3 DAYS Performed at [facility], [address], [place], [place] [address]    Report Status PENDING   Lactic acid, plasma   Collection Time: 07/12/21  5:12 AM  Result Value Ref Range   Lactic Acid, Venous 0.8 0.5 - 1.9 mmol/L  Resp Panel by RT-PCR (Flu A&B, Covid) Nasopharyngeal Swab   Collection Time: 07/12/21  8:22 AM   Specimen: Nasopharyngeal Swab; Nasopharyngeal(NP) swabs in vial transport medium  Result Value Ref Range   SARS Coronavirus 2 by RT PCR NEGATIVE NEGATIVE   Influenza A by PCR NEGATIVE NEGATIVE   Influenza B by PCR NEGATIVE NEGATIVE  CBC WITH DIFFERENTIAL   Collection Time: 07/13/21  6:51 AM  Result Value Ref Range   WBC 10.5 4.0 - 10.5 K/uL   RBC 4.01 3.87 - 5.11 MIL/uL   Hemoglobin 11.3 (L) 12.0 - 15.0 g/dL   HCT 94.7 (L) 09.6 - 28.3 %   MCV 89.3 80.0 - 100.0 fL   MCH 28.2 26.0 - 34.0 pg   MCHC 31.6 30.0 - 36.0 g/dL   RDW 66.2 94.7 - 65.4 %   Platelets 294 150 - 400 K/uL   nRBC 0.0 0.0 - 0.2 %   Neutrophils Relative % 63 %   Neutro Abs 6.8 1.7 - 7.7 K/uL   Lymphocytes Relative 19 %   Lymphs Abs 2.0 0.7 - 4.0 K/uL   Monocytes Relative 14 %   Monocytes Absolute 1.5 (H) 0.1 - 1.0  K/uL   Eosinophils Relative 2 %   Eosinophils Absolute 0.2 0.0 - 0.5 K/uL   Basophils Relative 1 %   Basophils Absolute 0.1 0.0 - 0.1 K/uL   Immature Granulocytes 1 %   Abs Immature Granulocytes 0.05 0.00 - 0.07 K/uL  Basic metabolic panel   Collection Time: 07/13/21  6:51 AM  Result Value Ref Range   Sodium 139 135 - 145 mmol/L   Potassium 4.0 3.5 - 5.1 mmol/L   Chloride 111 98 - 111 mmol/L   CO2 24 22 - 32 mmol/L   Glucose, Bld 84 70 - 99 mg/dL   BUN 12 6 - 20 mg/dL   Creatinine, Ser 2.54 0.44 - 1.00 mg/dL   Calcium 8.5 (L) 8.9 - 10.3 mg/dL   GFR, Estimated >27 >06 mL/min   Anion gap 4 (L) 5 - 15  Results for orders placed or performed during the hospital encounter of 07/04/21 (from the past 336 hour(s))  Resp Panel by RT-PCR (Flu A&B, Covid) Nasopharyngeal Swab   Collection Time: 07/04/21  4:47 AM   Specimen: Nasopharyngeal Swab; Nasopharyngeal(NP) swabs in vial transport medium  Result Value Ref Range   SARS Coronavirus 2 by RT PCR NEGATIVE NEGATIVE   Influenza A by PCR NEGATIVE NEGATIVE   Influenza B by PCR NEGATIVE NEGATIVE  CBC   Collection Time: 07/04/21  4:55 AM  Result Value Ref Range   WBC 10.6 (H) 4.0 - 10.5 K/uL   RBC 4.32 3.87 - 5.11 MIL/uL   Hemoglobin 12.0 12.0 - 15.0 g/dL   HCT 23.7 62.8 - 31.5 %   MCV 87.7 80.0 - 100.0 fL   MCH 27.8 26.0 - 34.0 pg   MCHC 31.7 30.0 - 36.0 g/dL   RDW 17.6 16.0 - 73.7 %   Platelets 281 150 - 400 K/uL   nRBC 0.0 0.0 - 0.2 %  RPR   Collection Time: 07/04/21  4:55 AM  Result Value Ref Range   RPR Ser Ql NON REACTIVE NON REACTIVE  Type and screen [facility]   Collection Time: 07/04/21  4:56 AM  Result Value Ref Range   ABO/RH(D) AB POS    Antibody Screen NEG    Sample Expiration      07/07/2021,2359 Performed at [facility], [address] [place], [place] [address]    No results found.    Assessment and Plan:     1. Pyelonephritis Current resolution of symptoms after inpatient  antibiotics.  Pt understands the need to complete the full course of antibiotics.  Pt will follow up at the time of her postpartum exam.       I discussed the assessment and treatment plan with the patient. The patient was provided an opportunity to ask questions and all were answered. The patient agreed with the plan and demonstrated an understanding of the instructions.  Conversation was completed through use of interpreter.   The patient was advised to call back or seek an in-person evaluation/go to the ED if the symptoms worsen or if the condition fails to improve as anticipated.  I provided 10 minutes of non-face-to-face time during this encounter.   Warden Fillers, MD Center for [employer], [facility]

## 2021-07-17 LAB — CULTURE, BLOOD (ROUTINE X 2)
Culture: NO GROWTH
Special Requests: ADEQUATE

## 2021-07-18 ENCOUNTER — Telehealth (HOSPITAL_COMMUNITY): Payer: Self-pay | Admitting: *Deleted

## 2021-07-18 NOTE — Telephone Encounter (Signed)
Interpreter reports mom feeling well. No concerns about herself. EPDS = 6 (Hospital score = 0) Interpreter reports baby is well. Eating, peeing, and pooping without difficulty. No concerns about baby.  Duffy Rhody, RN 07-18-2021 at 12:32pm

## 2021-08-01 DIAGNOSIS — Z419 Encounter for procedure for purposes other than remedying health state, unspecified: Secondary | ICD-10-CM | POA: Diagnosis not present

## 2021-08-15 ENCOUNTER — Ambulatory Visit (INDEPENDENT_AMBULATORY_CARE_PROVIDER_SITE_OTHER): Payer: Medicaid Other

## 2021-08-15 ENCOUNTER — Other Ambulatory Visit: Payer: Self-pay

## 2021-08-15 DIAGNOSIS — Z9189 Other specified personal risk factors, not elsewhere classified: Secondary | ICD-10-CM | POA: Diagnosis not present

## 2021-08-15 DIAGNOSIS — Z789 Other specified health status: Secondary | ICD-10-CM

## 2021-08-15 NOTE — Progress Notes (Signed)
Interpreter [phone]  Post Partum Visit Note  Brianna Haley is a 30 y.o. [id number] female who presents for a postpartum visit. She is 6 weeks postpartum following a normal spontaneous vaginal delivery.  I have fully reviewed the prenatal and intrapartum course. The delivery was at 37.5 gestational weeks.  Anesthesia: epidural. Postpartum course has been having back pain, neck pain and calf pain. Baby is doing well. Baby is feeding by bottle Rush Barer . Bleeding no bleeding. Bowel function is  some constipation . Bladder function is normal. Patient is sexually active. Last intercourse was yesterday. Contraception method is abstinence.  Interested in IUD.  Patient states she does not receive help with infant care.  She endorses safety at home and denies DV/A.  She endorses feelings of depression and states she doesn't know why.  She states she frequently feels like she wants to cry or scream or both. She states she gets more than 4 hours of sleep.   Postpartum depression screening: negative, score 4.   The pregnancy intention screening data noted above was reviewed. Potential methods of contraception were discussed. The patient elected to proceed with No data recorded.   Edinburgh Postnatal Depression Scale - 08/15/21 1030       Edinburgh Postnatal Depression Scale:  In the Past 7 Days   I have been able to laugh and see the funny side of things. 0    I have looked forward with enjoyment to things. 0    I have blamed myself unnecessarily when things went wrong. 0    I have been anxious or worried for no good reason. 0    I have felt scared or panicky for no good reason. 0    Things have been getting on top of me. 2    I have been so unhappy that I have had difficulty sleeping. 0    I have felt sad or miserable. 1    I have been so unhappy that I have been crying. 1    The thought of harming myself has occurred to me. 0    Edinburgh Postnatal Depression Scale Total 4             Health  Maintenance Due  Topic Date Due   INFLUENZA VACCINE  07/01/2021    The following portions of the patient's history were reviewed and updated as appropriate: allergies, current medications, past family history, past medical history, past social history, past surgical history, and problem list.  Review of Systems Pertinent items are noted in HPI.  Objective:  BP 103/64   Pulse 77   Wt 124 lb (56.2 kg)   BMI 21.28 kg/m    General:  alert, cooperative, and no distress   Breasts:  normal  Lungs: clear to auscultation bilaterally  Heart:  regular rate and rhythm  Abdomen: soft, non-tender; bowel sounds normal; no masses,  no organomegaly   Wound None  GU exam:  not indicated       Assessment:  6 weeks Postpartum Normal Involution Desires IUD Feeling of Depression Language Barrier Plan:    -Okay to return to normal activities as desired. -Brief review of hormonal vs non-hormonal IUDs. -Patient states husband reads [place].  Informational brochures given. -Informed that no placement today as patient is unsure of preferred type.  -Encouraged to stretch for c/o lower back pain since delivery. Informed that if it worsens will consider PT consult. -Referral sent for ambulatory behavioral health -Interpretations completed with assistance of Video Interpreter:  Thanak [id number].   Essential components of care per ACOG recommendations:  1.  Mood and well being: Patient with negative depression screening today. Reviewed local resources for support.  - Patient tobacco use? No.   - hx of drug use? No.    2. Infant care and feeding:  -Patient currently breastmilk feeding? No.  -Social determinants of health (SDOH) reviewed in EPIC. No concerns  3. Sexuality, contraception and birth spacing - Patient does not want a pregnancy in the next year.  Desired family size is 2 children.  - Reviewed forms of contraception in tiered fashion. Patient desires IUD, but is unsure of which type.   -  Discussed birth spacing of 18 months  4. Sleep and fatigue -Encouraged family/partner/community support of 4 hrs of uninterrupted sleep to help with mood and fatigue  5. Physical Recovery  - Discussed patients delivery and complications. She describes her labor as normal - Patient had a Vaginal, no problems at delivery. Patient had no laceration. Perineal healing reviewed. Patient expressed understanding - Patient has urinary incontinence? No. - Patient is safe to resume physical and sexual activity  6.  Health Maintenance - HM due items addressed Yes - Last pap smear  Diagnosis  Date Value Ref Range Status  06/06/2020      - Negative for intraepithelial lesion or malignancy (NILM)   Pap smear not done at today's visit.  -Breast Cancer screening indicated? No.   7. Chronic Disease/Pregnancy Condition follow up: None - PCP follow up  Cherre Robins, CNM Center for [employer], [facility]

## 2021-08-26 ENCOUNTER — Encounter: Payer: Medicaid Other | Admitting: Licensed Clinical Social Worker

## 2021-08-31 DIAGNOSIS — Z419 Encounter for procedure for purposes other than remedying health state, unspecified: Secondary | ICD-10-CM | POA: Diagnosis not present

## 2021-09-12 ENCOUNTER — Ambulatory Visit (INDEPENDENT_AMBULATORY_CARE_PROVIDER_SITE_OTHER): Payer: Medicaid Other | Admitting: Obstetrics and Gynecology

## 2021-09-12 ENCOUNTER — Encounter (HOSPITAL_COMMUNITY): Payer: Self-pay | Admitting: Emergency Medicine

## 2021-09-12 ENCOUNTER — Ambulatory Visit (HOSPITAL_COMMUNITY)
Admission: EM | Admit: 2021-09-12 | Discharge: 2021-09-12 | Disposition: A | Discharge status: Home / Self Care | Payer: Medicaid Other | Attending: Emergency Medicine | Admitting: Emergency Medicine

## 2021-09-12 ENCOUNTER — Encounter: Payer: Self-pay | Admitting: Obstetrics and Gynecology

## 2021-09-12 ENCOUNTER — Ambulatory Visit: Payer: Self-pay | Admitting: *Deleted

## 2021-09-12 ENCOUNTER — Other Ambulatory Visit: Payer: Self-pay

## 2021-09-12 VITALS — BP 109/73 | HR 79 | Ht 64.0 in | Wt 122.0 lb

## 2021-09-12 DIAGNOSIS — Z789 Other specified health status: Secondary | ICD-10-CM

## 2021-09-12 DIAGNOSIS — K219 Gastro-esophageal reflux disease without esophagitis: Secondary | ICD-10-CM

## 2021-09-12 DIAGNOSIS — M549 Dorsalgia, unspecified: Secondary | ICD-10-CM

## 2021-09-12 DIAGNOSIS — Z30430 Encounter for insertion of intrauterine contraceptive device: Secondary | ICD-10-CM

## 2021-09-12 LAB — POCT URINE PREGNANCY: Preg Test, Ur: NEGATIVE

## 2021-09-12 MED ORDER — ESOMEPRAZOLE MAGNESIUM 20 MG PO PACK: 20.0000 mg | PACK | Freq: Every day | ORAL | 0 refills | Status: DC

## 2021-09-12 MED ORDER — TIZANIDINE HCL 4 MG PO TABS: 4.0000 mg | ORAL_TABLET | Freq: Three times a day (TID) | ORAL | 0 refills | Status: DC | PRN

## 2021-09-12 MED ORDER — LEVONORGESTREL 20 MCG/DAY IU IUD
1.0000 | INTRAUTERINE_SYSTEM | Freq: Once | INTRAUTERINE | Status: AC
Start: 2021-09-12 — End: 2021-09-12
  Administered 2021-09-12: 1 via INTRAUTERINE

## 2021-09-12 MED ORDER — IBUPROFEN 600 MG PO TABS: 600.0000 mg | ORAL_TABLET | Freq: Four times a day (QID) | ORAL | 0 refills | Status: DC | PRN

## 2021-09-12 NOTE — Telephone Encounter (Signed)
Reason for Disposition  [1] SEVERE back pain (e.g., excruciating, unable to do any normal activities) AND [2] not improved 2 hours after pain medicine  Answer Assessment - Initial Assessment Questions 1. ONSET: "When did the pain begin?"      2 days ago 2. LOCATION: "Where does it hurt?" (upper, mid or lower back)     Top of back in middle close to neck 3. SEVERITY: "How bad is the pain?"  (e.g., Scale 1-10; mild, moderate, or severe)   - MILD (1-3): doesn't interfere with normal activities    - MODERATE (4-7): interferes with normal activities or awakens from sleep    - SEVERE (8-10): excruciating pain, unable to do any normal activities      severe 4. PATTERN: "Is the pain constant?" (e.g., yes, no; constant, intermittent)      constant 5. RADIATION: "Does the pain shoot into your legs or elsewhere?"     Pain goes into the legs 6. CAUSE:  "What do you think is causing the back pain?"      May have pulled something- picked up something heavy 7. BACK OVERUSE:  "Any recent lifting of heavy objects, strenuous work or exercise?"     Heavy lifting-2 days ago 8. MEDICATIONS: "What have you taken so far for the pain?" (e.g., nothing, acetaminophen, NSAIDS)     no 9. NEUROLOGIC SYMPTOMS: "Do you have any weakness, numbness, or problems with bowel/bladder control?"     no 10. OTHER SYMPTOMS: "Do you have any other symptoms?" (e.g., fever, abdominal pain, burning with urination, blood in urine)       no 11. PREGNANCY: "Is there any chance you are pregnant?" (e.g., yes, no; LMP)       No- LMP- 9/18- IUD placed today  Protocols used: Back Pain-A-AH

## 2021-09-12 NOTE — ED Triage Notes (Signed)
2 days ago lifted something and started having back pain.  Patient feels weak.  Patient points to multiple areas as having soreness.  Left has more pain than right.  Patient reports she did not fall.    Patient delivered a baby July 04, 2021

## 2021-09-12 NOTE — Telephone Encounter (Signed)
Patient was given [facility] number by [facility] provider for PCP follow up- patient is calling with recent injury to her back and back pain she relates as severe. Patient advised NP appointment are usually future appointment and since her pain is severe- she will need to be evaluated at [facility]. I will send request for NP appointment to the office- if they are not accepting at this time- they will advise her on who can see her. Patient is to go to [facility] for evaluation now. Interpreter#[id number]

## 2021-09-12 NOTE — ED Provider Notes (Signed)
HPI  SUBJECTIVE:  Brianna Haley is a 30 y.o. female who presents with upper midline constant, burning back pain starting yesterday that radiates up her neck and into both of her shoulders.  No direct trauma, change in physical activity although she picks up her 15-month-old baby and heavy groceries on a regular basis.  No grip or arm weakness, rash, fevers.  No aggravating or alleviating factors.  She has not tried anything for this.  She also reports having several episodes of burning substernal chest pain last night that is intermittent, lasting a second or 2 and then resolving.  She reports shortness of breath accompanying the chest pain.  It does not radiate up her neck, down her arm.  She denies accompanying nausea, vomiting, diaphoresis, exertional or positional component.  No recent increased belching, waterbrash, coughing, wheezing, calf pain, swelling, hemoptysis, surgery in the past 4 weeks, exogenous estrogen, recent immobilization.  This chest pain has resolved.  She has had no further episodes today.  Symptoms were better with walking/exertion, no aggravating factors.  It resolved on its own.  She has a past medical history of GERD and used to be on Nexium which worked well for her.  She is no longer taking it.  No history of diabetes, hypertension, MI, hypercholesterolemia, CVA/TIA, PAD/PVD, PE, DVT, atrial fibrillation, arrhythmia, cancer.  LMP: 9/18.  She is 2 months postpartum.  She is not breast-feeding.  She had a negative urine pregnancy test today.  Family history negative for early MI/CAD.  PMD: None.  All history obtained through video interpreter.  Past Medical History:  Diagnosis Date   Abdominal pain    Body aches    Chest discomfort    Constipation    Cough    Fatigue    Feeling of sadness    Fever    GERD (gastroesophageal reflux disease)    Helicobacter pylori antibody positive    History of spontaneous abortion    Language barrier, cultural differences    Malaise     Maternal varicella, non-immune    RBBB    Sinusitis chronic, frontal    SOB (shortness of breath)    Vitamin D insufficiency 01/12/2020    History reviewed. No pertinent surgical history.  Family History  Problem Relation Age of Onset   Healthy Mother    Healthy Father     Social History   Tobacco Use   Smoking status: Never   Smokeless tobacco: Never  Vaping Use   Vaping Use: Never used  Substance Use Topics   Alcohol use: No   Drug use: No    No current facility-administered medications for this encounter.  Current Outpatient Medications:    esomeprazole (NEXIUM) 20 MG packet, Take 20 mg by mouth daily before breakfast., Disp: 30 each, Rfl: 0   ibuprofen (ADVIL) 600 MG tablet, Take 1 tablet (600 mg total) by mouth every 6 (six) hours as needed., Disp: 30 tablet, Rfl: 0   tiZANidine (ZANAFLEX) 4 MG tablet, Take 1 tablet (4 mg total) by mouth every 8 (eight) hours as needed for muscle spasms., Disp: 30 tablet, Rfl: 0  No Known Allergies   ROS  As noted in HPI.   Physical Exam  BP 112/69 (BP Location: Left Arm)   Pulse 78   Temp 98.7 F (37.1 C) (Oral)   Resp 18   LMP 08/18/2021   SpO2 99%   Constitutional: Well developed, well nourished, no acute distress Eyes:  EOMI, conjunctiva normal bilaterally HENT: Normocephalic, atraumatic,mucus  membranes moist Respiratory: Normal inspiratory effort, lungs clear bilaterally. Cardiovascular: Normal rate, regular rhythm, no murmurs rubs or gallops.  No chest wall tenderness. GI: nondistended skin: No rash of her upper back, skin intact Musculoskeletal: Positive bilateral trapezial tenderness, spasm.  Positive tenderness over the right rhomboid, right paralumbar region.  No C, T, L-spine tenderness.  No CVAT.  Calves symmetric, nontender, no edema. Neurologic: Alert & oriented x 3, no focal neuro deficits Psychiatric: Speech and behavior appropriate   ED Course   Medications - No data to display  No orders of the  defined types were placed in this encounter.   Results for orders placed or performed in visit on 09/12/21 (from the past 24 hour(s))  POCT urine pregnancy     Status: Normal   Collection Time: 09/12/21  9:28 AM  Result Value Ref Range   Preg Test, Ur Negative Negative   No results found.  ED Clinical Impression  1. Upper back pain   2. Gastroesophageal reflux disease, unspecified whether esophagitis present      ED Assessment/Plan  Back pain.  Appears to be very musculoskeletal.  home with Tylenol/ibuprofen, Zanaflex.  She is not breast-feeding.  2.  Chest pain.  She is currently asymptomatic.  She is very low risk for ACS.  Deferring EKG.  Doubt dissection, pneumonia, pneumothorax.  Deferring chest x-ray.  Doubt PE, she is PERC negative.  Suspect uncontrolled GERD.  Will refill Nexium.  Patient does not remember dosage, nor is it in her records.  Starting at 20 mg daily.  Strict ER return precautions given.  Will provide primary care list for ongoing care and order assistance in finding a PMD.  Using the video interpreter, discussed  MDM, treatment plan, and plan for follow-up with patient. Discussed sn/sx that should prompt return to the ED. patient agrees with plan.  Spent 45 minutes doing the H&P, explaining MDM, treatment plan with patient Meds ordered this encounter  Medications   ibuprofen (ADVIL) 600 MG tablet    Sig: Take 1 tablet (600 mg total) by mouth every 6 (six) hours as needed.    Dispense:  30 tablet    Refill:  0   tiZANidine (ZANAFLEX) 4 MG tablet    Sig: Take 1 tablet (4 mg total) by mouth every 8 (eight) hours as needed for muscle spasms.    Dispense:  30 tablet    Refill:  0   esomeprazole (NEXIUM) 20 MG packet    Sig: Take 20 mg by mouth daily before breakfast.    Dispense:  30 each    Refill:  0      *This clinic note was created using Scientist, clinical (histocompatibility and immunogenetics). Therefore, there may be occasional mistakes despite careful proofreading.  ?     Domenick Gong, MD 09/12/21 1517

## 2021-09-12 NOTE — ED Notes (Signed)
Received call from [facility] needing to change nexium RX to capsule rather than packet due to cost. Okay per provider.

## 2021-09-12 NOTE — Discharge Instructions (Addendum)
Take the ibuprofen with 500 mg of Tylenol 3-4 times a day as needed for pain.  Zanaflex will help with muscle spasms.  Nexium will help with the burning chest pain.  Follow-up with your doctor of your choice ASAP.  Go immediately to the emergency department if your chest pain returns, gets worse, or for any other concerns.   Below is a list of primary care practices who are taking new patients for you to follow-up with.  [facility] internal medicine clinic Ground Floor - [facility], [address] [place], [place], [place] [address] [phone]  [facility] Primary Care at [facility] [address] [place], [place] [address] [phone]  [facility] [address] [place], [place] [address] [phone]  [facility] [phone] [address] [place] [address]  [facility]: [address] [place] [address]  [phone]  [facility]: [address] [place] [address]  [phone]  [place] [facility] : [address] [place] [address] [phone]  [facility] Primary Care: [address] [place] [address] [phone]  [facility]: [address] [place] [address] [phone]  Dr. Oneal Grout [address] [facility] [place] [address]  [phone]  Go to www.goodrx.com  or www.costplusdrugs.com to look up your medications. This will give you a list of where you can find your prescriptions at the most affordable prices. Or ask the pharmacist what the cash price is, or if they have any other discount programs available to help make your medication more affordable. This can be less expensive than what you would pay with insurance.

## 2021-09-12 NOTE — Progress Notes (Signed)
Pt presents today for IUD insertion. LMP: 08/18/21. Pt is not currently breastfeeding. Pt had recent vaginal delivery on 07/04/21.

## 2021-09-12 NOTE — Patient Instructions (Signed)

## 2021-09-12 NOTE — Progress Notes (Signed)
    GYNECOLOGY CLINIC PROCEDURE NOTE  Brianna Haley is a 30 y.o. [id number] here for Mirena IUD insertion. No GYN concerns.  Last pap smear was on 7/21 and was normal.  IUD Insertion Procedure Note Patient identified, informed consent performed, consent signed.   Discussed risks of irregular bleeding, cramping, infection, malpositioning or misplacement of the IUD outside the uterus which may require further procedure such as laparoscopy. Time out was performed.  Urine pregnancy test negative.  Speculum placed in the vagina.  Cervix visualized.  Cleaned with Betadine x 2.  Grasped anteriorly with a single tooth tenaculum.  Uterus sounded to 9 cm.  Mirena IUD placed per manufacturer's recommendations.  Strings trimmed to 3 cm. Tenaculum was removed, good hemostasis noted.  Patient tolerated procedure well.   Patient was given post-procedure instructions.  She was advised to have backup contraception for one week.  Patient was also asked to check IUD strings periodically and follow up in 4 weeks for IUD check.    Brianna Elm, MD, FACOG Attending Obstetrician & Gynecologist Center for [facility], [facility]

## 2021-09-13 NOTE — Telephone Encounter (Signed)
I have tried to call the patient to schedule a hospital fu appointment for Nov 29 at [facility]. Patient has a vm that is not set up and was not able to leave message. If patient calls back please assist her on scheduling a hospital fu appointment for next available if date is no longer open to schedule.  Thank you

## 2021-10-01 DIAGNOSIS — Z419 Encounter for procedure for purposes other than remedying health state, unspecified: Secondary | ICD-10-CM | POA: Diagnosis not present

## 2021-10-04 ENCOUNTER — Ambulatory Visit (INDEPENDENT_AMBULATORY_CARE_PROVIDER_SITE_OTHER): Payer: Medicaid Other | Admitting: Obstetrics and Gynecology

## 2021-10-04 ENCOUNTER — Other Ambulatory Visit: Payer: Self-pay

## 2021-10-04 ENCOUNTER — Encounter: Payer: Self-pay | Admitting: Obstetrics and Gynecology

## 2021-10-04 DIAGNOSIS — Z30431 Encounter for routine checking of intrauterine contraceptive device: Secondary | ICD-10-CM | POA: Diagnosis not present

## 2021-10-04 NOTE — Progress Notes (Signed)
Pt is here for IUD check, pt state some occ pain.

## 2021-10-04 NOTE — Patient Instructions (Signed)

## 2021-10-04 NOTE — Progress Notes (Signed)
Pt here for IUD check Mirena IUD placed 09/12/21 Doing well. Occ cramps No problems with intercourse  PE AF VSS Chaperone present  Lungs clear Heart RRR Abd soft + BS GU Nl EGBUS, IUD strings noted  A/P IUD check  Doing well. F/U in 1 yr or PRN

## 2021-10-07 ENCOUNTER — Ambulatory Visit (INDEPENDENT_AMBULATORY_CARE_PROVIDER_SITE_OTHER): Payer: Self-pay | Admitting: *Deleted

## 2021-10-07 NOTE — Telephone Encounter (Signed)
I used [employer] [phone] Molly Maduro for Brianna Haley.  I returned her call.  She called c/o not feeling well earlier today.    I would like to see the doctor.   I have congestion and I'm tired.   I have nausea.  Reason for Disposition  [1] Sinus congestion (pressure, fullness) AND [2] present > 10 days  Answer Assessment - Initial Assessment Questions 1. ONSET: "When did the nasal discharge start?"      I have a runny nose for 2 days and congestion.   No sore throat.  Also nausea.  I also feel tired.  Before yesterday I had diarrhea for 1 day.     2. AMOUNT: "How much discharge is there?"      Not any more runny nose now. 3. COUGH: "Do you have a cough?" If yes, ask: "Describe the color of your sputum" (clear, white, yellow, green)     Yes dry cough.   4. RESPIRATORY DISTRESS: "Describe your breathing."      No wheezing just congestion.   Congestion is in my chest.  No chest pain or tightness. 5. FEVER: "Do you have a fever?" If Yes, ask: "What is your temperature, how was it measured, and when did it start?"     No fever 6. SEVERITY: "Overall, how bad are you feeling right now?" (e.g., doesn't interfere with normal activities, staying home from school/work, staying in bed)      I feel tired and weak. 7. OTHER SYMPTOMS: "Do you have any other symptoms?" (e.g., sore throat, earache, wheezing, vomiting)     No sore throat, wheezing or vomiting or diarrhea.  No ear pain.   Poor appetite or 2 weeks. 8. PREGNANCY: "Is there any chance you are pregnant?" "When was your last menstrual period?"     Not pregnant  Protocols used: Common Cold-A-AH

## 2021-10-07 NOTE — Telephone Encounter (Signed)
I returned pt's call using [address], Molly Maduro [phone] for [name].  Pt had called in earlier requesting a sooner appt than Nov. 17, 2022 because she still isn't feeling well.  She had a runny nose for 2 days but that has resolved now.  She is c/o feeling tired and having a dry cough.    She also c/o nausea and poor appetite for 2 weeks now.  Two days ago she had diarrhea for that one day.   No diarrhea now or vomiting.  No headaches.   Denies sore throat, stuffy or runny nose now, no ear pain.   She mentioned she delivered a baby 2 months ago.   She mentioned she took some OTC medication for the runny nose and that stopped the runny nose.  Denies chest pain/tightness or shortness of breath or wheezing.    She mentioned hurting her back due to heavy lifting and a doctor gave her medication for that.   The coughing started when she hurt her back.  (Per chart she was seen on 09/12/2021 at [facility] for upper back pain and substernal chest pain that was treated with Nexium for GERD).    I have put her on the waiting list for [facility] and instructed her to go to the urgent care if her symptoms become worse between now and her appt on 11/17.    I also sent a note to [facility] to see if they could see her sooner.   She was agreeable to this plan.  Notes sent to [facility]

## 2021-10-17 ENCOUNTER — Other Ambulatory Visit: Payer: Self-pay

## 2021-10-17 ENCOUNTER — Ambulatory Visit (INDEPENDENT_AMBULATORY_CARE_PROVIDER_SITE_OTHER): Payer: Medicaid Other | Admitting: Primary Care

## 2021-10-17 ENCOUNTER — Encounter (INDEPENDENT_AMBULATORY_CARE_PROVIDER_SITE_OTHER): Payer: Self-pay | Admitting: Primary Care

## 2021-10-17 VITALS — BP 127/81 | HR 109 | Temp 96.6°F | Ht 64.0 in | Wt 123.6 lb

## 2021-10-17 DIAGNOSIS — M542 Cervicalgia: Secondary | ICD-10-CM

## 2021-10-17 MED ORDER — IBUPROFEN 600 MG PO TABS: 600.0000 mg | ORAL_TABLET | Freq: Four times a day (QID) | ORAL | 0 refills | Status: DC | PRN

## 2021-10-17 MED ORDER — TIZANIDINE HCL 4 MG PO TABS: 4.0000 mg | ORAL_TABLET | Freq: Three times a day (TID) | ORAL | 0 refills | Status: DC | PRN

## 2021-10-17 NOTE — Progress Notes (Signed)
[facility]  Subjective: CC: back pain PCP: Brianna Perna, NP HPI: [name] is a 30 y.o. 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  Patient had a baby in August and epidural was used.  However pain has recently started approximately in October after lifting something heavy.   1. Back Pain/Shoulder  Patient reports that pain began OCTOBER.  No h/o back pain.  Pain is a 8/10.  It radiate from cervical region.  Trying to hold her baby , cooking , cleaning worsens pain.  Muscle relaxer and ibuprofen  improves pain.  Patient has been taking Muscle relaxer and ibuprofen for pain with relief.  Patient denies trauma or injury.  She denies  dysuria, hematuria, fevers, chills, nausea, vomiting, abdominal pain, renal stones. Aggravating factors: certain movements and prolonged walking/standing. Alleviating factors: rest. Progressive LE weakness or saddle anesthesia: none. Extremity sensation changes or weakness: none. Ambulatory without difficulty. Normal bowel/bladder habits: yes; without urinary retention. Normal PO intake without n/v. No associated abdominal pain/n/v. Self treatment: has OTC analgesics, with minimal relief. Patient denies: urinary retention/incontinence, bowel incontinence, weakness, falls, sensation changes or pain anywhere else. No h/o back surgeries.   No current outpatient medications on file. No Known Allergies  Past Medical History:  Diagnosis Date   Abdominal pain    Body aches    Chest discomfort    Constipation    Cough    Fatigue    Feeling of sadness    Fever    GERD (gastroesophageal reflux disease)    Helicobacter pylori antibody positive    History of spontaneous abortion    Language barrier, cultural differences    Malaise    Maternal varicella, non-immune    RBBB    Sinusitis chronic, frontal    SOB (shortness of breath)    Vitamin D insufficiency 01/12/2020    Social History   Socioeconomic History   Marital status: Married    Spouse name: Not on file   Number of children: Not on file   Years of education: Not on file   Highest education level: Not on file  Occupational History   Not on file  Tobacco Use   Smoking status: Never   Smokeless tobacco: Never  Vaping Use   Vaping Use: Never used  Substance and Sexual Activity   Alcohol use: No   Drug use: No   Sexual activity: Yes    Partners: Male    Birth control/protection: None    Comment: pregnant   Other Topics Concern   Not on file  Social History Narrative   Not on file   Social Determinants of Health   Financial Resource Strain: Not on file  Food Insecurity: Not on file  Transportation Needs: Not on file  Physical Activity: Not on file  Stress: Not on file  Social Connections: Not on file  Intimate Partner Violence: Not on file   No past surgical history on file.  ROS: per HPI  Objective: Office vital signs reviewed. BP 127/81 (BP Location: Right Arm, Patient Position: Sitting, Cuff Size: Normal)   Pulse (!) 109   Temp (!) 96.6 F (35.9 C) (Temporal)   Ht 5\' 4"  (1.626 m)   Wt 123 lb 9.6 oz (56.1 kg)   LMP 09/17/2021 (Exact Date)   SpO2 93%   Breastfeeding No   BMI 21.22 kg/m   Physical Examination:  General: Awake, alert, under weight Body mass index is 21.22 kg/m.  nourished, NAD Cardio: Regular rate and rhythm, S1S2 heard, no murmurs appreciated Pulm: Clear to auscultation bilaterally, no wheezes, rhonchi or rales Extremities: Warm, well-perfused. No edema, cyanosis or clubbing; + pulses bilaterally MSK: normal gait and station  Normal no curvatures or kyphosis Spine: + AROM, +midline tenderness to palpation, no paraspinal tenderness to palpation.  No palpable bony deformities,  yes straight leg test- negative  Neuro: 5/5 lower extremity strength; lower extremity light touch sensation grossly intact, - heel walk, Toe Walk and Tandem  Walk  Assessment/ Plan: Brianna Haley was seen today for hospitalization follow-up.  Diagnoses and all orders for this visit:  Cervical pain Pain radiates from neck to shoulder , back and down legs  -     AMB referral to orthopedics     The above assessment and management plan was discussed with the patient. The patient verbalized understanding of and has agreed to the management plan. Patient is aware to call the clinic if symptoms persist or worsen. Patient is aware when to return to the clinic for a follow-up visit. Patient educated on when it is appropriate to go to the emergency department.   This note has been created with Education officer, environmental. Any transcriptional errors are unintentional.   Brianna Sessions, NP 10/17/2021, 10:45 AM

## 2021-10-17 NOTE — Progress Notes (Signed)
Pain in back shoulders and legs

## 2021-10-31 DIAGNOSIS — Z419 Encounter for procedure for purposes other than remedying health state, unspecified: Secondary | ICD-10-CM | POA: Diagnosis not present

## 2021-11-12 ENCOUNTER — Ambulatory Visit (INDEPENDENT_AMBULATORY_CARE_PROVIDER_SITE_OTHER): Payer: Medicaid Other

## 2021-11-12 ENCOUNTER — Other Ambulatory Visit: Payer: Self-pay

## 2021-11-12 ENCOUNTER — Encounter: Payer: Self-pay | Admitting: Orthopaedic Surgery

## 2021-11-12 ENCOUNTER — Ambulatory Visit (INDEPENDENT_AMBULATORY_CARE_PROVIDER_SITE_OTHER): Payer: Medicaid Other | Admitting: Orthopaedic Surgery

## 2021-11-12 VITALS — BP 117/74 | HR 87 | Ht 64.0 in | Wt 119.0 lb

## 2021-11-12 DIAGNOSIS — M542 Cervicalgia: Secondary | ICD-10-CM

## 2021-11-12 NOTE — Progress Notes (Signed)
Office Visit Note   Patient: Brianna Haley           Date of Birth: Aug 22, 1991           MRN: [id number] Visit Date: 11/12/2021              Requested by: Grayce Sessions, NP [address] [place],  [place] [address] PCP: Grayce Sessions, NP   Assessment & Plan: Visit Diagnoses:  1. Neck pain     Plan: We will set patient up for some physical therapy.  Patient was seen with an interpreter and is from [place].  Recheck 8 weeks.  Follow-Up Instructions: No follow-ups on file.   Orders:  Orders Placed This Encounter  Procedures   XR Cervical Spine 2 or 3 views   No orders of the defined types were placed in this encounter.     Procedures: No procedures performed   Clinical Data: No additional findings.   Subjective: Chief Complaint  Patient presents with   Neck - Pain    HPI 30 year old female with 2 children age 24 years and the other child 4 months has had pain in her posterior neck pain in the top of her shoulders pain that radiates down more left arm the right arm with some burning in the posterior occipital region.  Patient states occasionally she has had some numbness and tingling in her legs.  Patient is taken some ibuprofen at night she states ibuprofen makes her sleepy tired for to get up at night with her 43-month-old.  Negative for falling.  She is able to ambulate stairs.  Review of Systems all systems are negative as a pertains HPI.   Objective: Vital Signs: BP 117/74    Pulse 87    Ht 5\' 4"  (1.626 m)    Wt 119 lb (54 kg)    BMI 20.43 kg/m   Physical Exam Constitutional:      Appearance: She is well-developed.  HENT:     Head: Normocephalic.     Right Ear: External ear normal.     Left Ear: External ear normal. There is no impacted cerumen.  Eyes:     Pupils: Pupils are equal, round, and reactive to light.  Neck:     Thyroid: No thyromegaly.     Trachea: No tracheal deviation.  Cardiovascular:     Rate and Rhythm: Normal rate.   Pulmonary:     Effort: Pulmonary effort is normal.  Abdominal:     Palpations: Abdomen is soft.  Musculoskeletal:     Cervical back: No rigidity.  Skin:    General: Skin is warm and dry.  Neurological:     Mental Status: She is alert and oriented to person, place, and time.  Psychiatric:        Behavior: Behavior normal.    Ortho Exam patient has some brachial plexus tenderness more trapezial tenderness.  No lower extremity clonus normal reflexes upper and lower extremities.  Negative impingement.  Positive trapezial pinch test with radicular arm symptoms.  Specialty Comments:  No specialty comments available.  Imaging: No results found.   PMFS History: Patient Active Problem List   Diagnosis Date Noted   IUD check up 10/04/2021   Encounter for insertion of mirena IUD 09/12/2021   Language barrier 12/04/2016   Past Medical History:  Diagnosis Date   Abdominal pain    Body aches    Chest discomfort    Constipation    Cough    Fatigue  Feeling of sadness    Fever    GERD (gastroesophageal reflux disease)    Helicobacter pylori antibody positive    History of spontaneous abortion    Language barrier, cultural differences    Malaise    Maternal varicella, non-immune    RBBB    Sinusitis chronic, frontal    SOB (shortness of breath)    Vitamin D insufficiency 01/12/2020    Family History  Problem Relation Age of Onset   Healthy Mother    Healthy Father     No past surgical history on file. Social History   Occupational History   Not on file  Tobacco Use   Smoking status: Never   Smokeless tobacco: Never  Vaping Use   Vaping Use: Never used  Substance and Sexual Activity   Alcohol use: No   Drug use: No   Sexual activity: Yes    Partners: Male    Birth control/protection: None    Comment: pregnant

## 2021-11-14 ENCOUNTER — Encounter (INDEPENDENT_AMBULATORY_CARE_PROVIDER_SITE_OTHER): Payer: Self-pay

## 2021-11-22 ENCOUNTER — Other Ambulatory Visit: Payer: Self-pay | Admitting: Nurse Practitioner

## 2021-11-28 NOTE — Therapy (Signed)
OUTPATIENT PHYSICAL THERAPY SHOULDER EVALUATION   Patient Name: Brianna Haley MRN: [id number] DOB:10-31-1991, 30 y.o., female Today's Date: 12/04/2021   PT End of Session - 12/04/21 1137     Visit Number 1    Number of Visits 16    Date for PT Re-Evaluation 01/29/22    Authorization Type [employer]    PT Start Time 1045    PT Stop Time 1130    PT Time Calculation (min) 45 min             Past Medical History:  Diagnosis Date   Abdominal pain    Body aches    Chest discomfort    Constipation    Cough    Fatigue    Feeling of sadness    Fever    GERD (gastroesophageal reflux disease)    Helicobacter pylori antibody positive    History of spontaneous abortion    Language barrier, cultural differences    Malaise    Maternal varicella, non-immune    RBBB    Sinusitis chronic, frontal    SOB (shortness of breath)    Vitamin D insufficiency 01/12/2020   History reviewed. No pertinent surgical history. Patient Active Problem List   Diagnosis Date Noted   IUD check up 10/04/2021   Encounter for insertion of mirena IUD 09/12/2021   Language barrier 12/04/2016    PCP: Grayce Sessions, NP  REFERRING PROVIDER: Eldred Manges, MD  REFERRING DIAG: Referral diagnosis: Neck pain [M54.2]  THERAPY DIAG:  Cervicalgia  Muscle weakness   ONSET DATE: ~08/31/21  SUBJECTIVE:                                                                                                                                                                                           Brianna Haley is a 30 y.o. female who presents to clinic with chief complaint of neck and bil shoulder pain.  MOI/History of condition:  Pain started about 3 months ago after giving birth and then lifting more things such as the car seat regularly.  The pain originates in the neck and radiates to both UE.  She feels she is weak in her hands and arms.  She denies n/t in her arms.  The pain is worst with lifting  From  referring provider: "HPI 30 year old female with 2 children age 26 years and the other child 4 months has had pain in her posterior neck pain in the top of her shoulders pain that radiates down more left arm the right arm with some burning in the posterior occipital region.  Patient states occasionally she has had some numbness  and tingling in her legs.  Patient is taken some ibuprofen at night she states ibuprofen makes her sleepy tired for to get up at night with her 31-month-old.  Negative for falling.  She is able to ambulate stairs."   Red flags:  She denies gait changes  Pertinent past history:  none  Pain:  Are you having pain? 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 No, Number of falls: 0  LIVING ENVIRONMENT: Lives with: lives with their family  PLOF: Independent  DIAGNOSTIC FINDINGS:  X-Ray 12/13: AP lateral cervical spine images were obtained and reviewed.  This shows  normal disc space, normal alignment no curvature.  No spondylolisthesis no  degenerative changes.   Impression: Normal cervical spine radiographs.    OBJECTIVE:   GENERAL OBSERVATION:  Slight rounded shoulders; forward head     SENSATION:  Light touch: Appears intact   PALPATION: Diffuse myofacial tenderness bil sub occipitals, cx paraspinals, UT, and bil LS  Cervical ROM  ROM ROM  12/04/2021  Flexion N  Extension N   Right lateral flexion *pulling on L  Left lateral flexion N  Right rotation N  Left rotation N  Flexion rotation (normal is 30 degrees)   Flexion rotation (normal is 30 degrees)     (Blank rows = not tested n=normal)   UPPER EXTREMITY MMT:  MMT Right 12/04/2021 Left 12/04/2021  Shoulder flexion 4 4*forearm  Shoulder abduction (C5) 4 4*forearm  Shoulder ER N N  Shoulder IR    Middle trapezius 3+ 3*  Lower trapezius 3+ 3+  Shoulder extension    Grip strength    Cervical flexion (C1,C2)    Cervical S/B (C3)    Shoulder shrug (C4) n n  Elbow flexion (C6) n n  Elbow ext (C7) n n  Thumb ext (C8) n n  Finger abd (T1) n n   (Blank rows = not tested, score listed is out of 5 possible points.  N = WNL, D = diminished, * = concordant pain with testing)   SPECIAL TESTS:  Spurlings: (-) bil  Hoffman's: (-) bil  ULTT A: (+) L NT R    JOINT MOBILITY TESTING:  Not today  PATIENT SURVEYS:  none    TODAY'S TREATMENT:  HEP   PATIENT EDUCATION:  POC, diagnosis, prognosis, HEP, and outcome measures.  Pt educated via explanation, demonstration, and handout (HEP).  Pt confirms understanding verbally.    HOME EXERCISE PROGRAM: Access Code: [email or web address] URL: [email or web address] Date: 12/04/2021 Prepared by: Alphonzo Severance  Exercises Median Nerve Glide - 2 x daily - 7 x weekly - 3 sets - 10 reps Supine Shoulder Horizontal Abduction with Resistance - 1 x daily - 7 x weekly - 3 sets - 10 reps Supine Cervical Retraction with Towel - 2 x daily - 7 x weekly - 3 sets - 10 reps   ASSESSMENT:  CLINICAL IMPRESSION: Brianna Haley is a 30 y.o. female who presents to clinic with signs and  sxs consistent with neck pain and UE pain L>R with myofacial components (neck and periscapular region) and neural tension L forearm.  No signs of clear radiculopathy or myelopathy.  Patient presents with pain and impairments/deficits in: periscapular strength and posture.  Activity limitations  include: lifting and reaching.  Participation limitations include: difficulty holding baby and ADLs involving lifting.  Patient will benefit from skilled therapy to address pain and the listed deficits in order to achieve functional goals, enable safety and independence in completion of daily tasks, and return to PLOF.   REHAB POTENTIAL: Good  CLINICAL DECISION MAKING: Stable/uncomplicated  EVALUATION COMPLEXITY: Low   GOALS:  SHORT TERM GOALS:  STG Name Target Date Goal status  1 Brianna Haley will be >75% HEP compliant to improve carryover between sessions and facilitate independent management of condition  Baseline: No HEP 12/25/2021 INITIAL   LONG TERM GOALS:   LTG Name Target Date Goal status  1 Brianna Haley will improve the following MMTs to >/= 4/5 to show improvement in strength:  mid and lower trap   Baseline:   Middle trapezius 3+ 3*  Lower trapezius 3+ 3+   01/29/2022 INITIAL  2 Brianna Haley will report >/= 50% decrease in pain from evaluation   Baseline: 8/10 max pain 01/29/2022 INITIAL  3 Brianna Haley will be able to lift 45 lbs from floor to waist height, not limited by pain  Baseline: unable 01/29/2022 INITIAL   PLAN: PT FREQUENCY: 1-2x/week  PT DURATION: 8 weeks (Ending 01/29/2022)  PLANNED INTERVENTIONS: Therapeutic exercises, Therapeutic activity, Neuro Muscular re-education, Gait training, Patient/Family education, Joint mobilization, Dry Needling, Electrical stimulation, Spinal mobilization and/or manipulation, Moist heat, Taping, Vasopneumatic device, Ionotophoresis 4mg /ml Dexamethasone, and Manual therapy  PLAN FOR NEXT SESSION: assess HEP, cervical and periscapular strengthening, neural glides, progressive UE strengthening, grip strength goal?     PT, DPT 12/04/2021, 11:39 AM

## 2021-12-01 DIAGNOSIS — Z419 Encounter for procedure for purposes other than remedying health state, unspecified: Secondary | ICD-10-CM | POA: Diagnosis not present

## 2021-12-04 ENCOUNTER — Encounter: Payer: Self-pay | Admitting: Physical Therapy

## 2021-12-04 ENCOUNTER — Other Ambulatory Visit: Payer: Self-pay

## 2021-12-04 ENCOUNTER — Ambulatory Visit: Payer: Medicaid Other | Attending: Orthopaedic Surgery | Admitting: Physical Therapy

## 2021-12-04 DIAGNOSIS — M6281 Muscle weakness (generalized): Secondary | ICD-10-CM | POA: Insufficient documentation

## 2021-12-04 DIAGNOSIS — M542 Cervicalgia: Secondary | ICD-10-CM | POA: Insufficient documentation

## 2021-12-11 NOTE — Therapy (Signed)
OUTPATIENT PHYSICAL THERAPY TREATMENT NOTE   Patient Name: Brianna Haley MRN: [id number] DOB:05/03/1991, 31 y.o., female Today's Date: 12/12/2021  PCP: Kerin Perna, NP REFERRING PROVIDER: Kerin Perna, NP   PT End of Session - 12/12/21 1126     Visit Number 2    Number of Visits 16    Date for PT Re-Evaluation 01/29/22    Authorization Type [employer]    Authorization Time Period 12/12/2021-02/10/2022    Authorization - Visit Number 1    Authorization - Number of Visits 12    PT Start Time 1130    PT Stop Time 1215    PT Time Calculation (min) 45 min    Activity Tolerance Patient tolerated treatment well    Behavior During Therapy WFL for tasks assessed/performed             Past Medical History:  Diagnosis Date   Abdominal pain    Body aches    Chest discomfort    Constipation    Cough    Fatigue    Feeling of sadness    Fever    GERD (gastroesophageal reflux disease)    Helicobacter pylori antibody positive    History of spontaneous abortion    Language barrier, cultural differences    Malaise    Maternal varicella, non-immune    RBBB    Sinusitis chronic, frontal    SOB (shortness of breath)    Vitamin D insufficiency 01/12/2020   History reviewed. No pertinent surgical history. Patient Active Problem List   Diagnosis Date Noted   IUD check up 10/04/2021   Encounter for insertion of mirena IUD 09/12/2021   Language barrier 12/04/2016    REFERRING DIAG: Neck pain [M54.2]  THERAPY DIAG:  Cervicalgia  Muscle weakness  PERTINENT HISTORY: None   SUBJECTIVE: Pt reports that her neck pain is about the same as at her eval, adding that she has had varied adherence to her HEP.  PAIN:  Are you having pain? Yes NPRS scale: 2-3/10 Pain location: Neck Pain orientation: Left  PAIN TYPE: aching Pain description: intermittent      OBJECTIVE:   *Unless otherwise noted, objective information collected previously* GENERAL OBSERVATION:           Slight rounded shoulders; forward head                               SENSATION:          Light touch: Appears intact           PALPATION: Diffuse myofacial tenderness bil sub occipitals, cx paraspinals, UT, and bil LS   Cervical ROM   ROM ROM  12/04/2021  Flexion N  Extension N  Right lateral flexion *pulling on L  Left lateral flexion N  Right rotation N  Left rotation N  Flexion rotation (normal is 30 degrees)    Flexion rotation (normal is 30 degrees)      (Blank rows = not tested n=normal)     UPPER EXTREMITY MMT:   MMT Right 12/04/2021 Left 12/04/2021  Shoulder flexion 4 4*forearm  Shoulder abduction (C5) 4 4*forearm  Shoulder ER N N  Shoulder IR      Middle trapezius 3+ 3*  Lower trapezius 3+ 3+  Shoulder extension      Grip strength      Cervical flexion (C1,C2)      Cervical S/B (C3)      Shoulder  shrug (C4) n n  Elbow flexion (C6) n n  Elbow ext (C7) n n  Thumb ext (C8) n n  Finger abd (T1) n n    (Blank rows = not tested, score listed is out of 5 possible points.  N = WNL, D = diminished, * = concordant pain with testing)     SPECIAL TESTS:           Spurlings: (-) bil           Hoffman's: (-) bil           ULTT A: (+) L NT R              JOINT MOBILITY TESTING:  Not today   PATIENT SURVEYS:  none              TODAY'S TREATMENT:   [facility] Adult PT Treatment:                                                DATE: 12/12/2021 Therapeutic Exercise: Seated low rows with 25# cable 2x10 Seated high rows with 25# cable 2x10 Seated lat pull-down with 25# cable 2x10 Low trap lift-off at wall 3x10 Supine DNF endurance with head lift/ chin tuck 4x to exhaustion Push-up plus (alternating scapular retraction/protraction) 3x15 Cervical rotation isotonic contractions 2x10 BIL with 5-sec force production Manual Therapy: Prone grade V CT junction manipulation x1 BIL with cavitation Prone thoracic grade V manipulation x4 throughout with cavitation Prone  effleurage to BIL cervical paraspinals Neuromuscular re-ed: N/A Therapeutic Activity: N/A Modalities: N/A Self Care: N/A      PATIENT EDUCATION:  Pt educated on proper form with updated HEP.     HOME EXERCISE PROGRAM: Access Code: [email or web address] URL: [email or web address] Date: 12/04/2021 Prepared by: Shearon Balo   Exercises Median Nerve Glide - 2 x daily - 7 x weekly - 3 sets - 10 reps Supine Shoulder Horizontal Abduction with Resistance - 1 x daily - 7 x weekly - 3 sets - 10 reps Supine Cervical Retraction with Towel - 2 x daily - 7 x weekly - 3 sets - 10 reps  Added 12/12/2021: Low trap slides at wall with lift-off - 1 x daily - 7 x weekly - 3 sets - 10 reps Standing Shoulder Row with Anchored Resistance - 1 x daily - 7 x weekly - 3 sets - 10 reps - 3-sec hold     ASSESSMENT:   CLINICAL IMPRESSION: Pt responded well to all interventions today, demonstrating proper form and no increase in pain with selected exercises. Additionally she reports decrease in pain from 3/10 to 0/10 following CT junction manipulation and effleurage to BIL cervical paraspinals. Likewise, her cervical rotation AROM was visually improved BIL following CT junction manipulation. She reports that selected exercises provide a good challenge without being painful. She leaves clinic with 0/10 pain and mild fatigue. She will continue to benefit from skilled PT to address her primary impairments and return to her prior level of function with less limitation.     REHAB POTENTIAL: Good   CLINICAL DECISION MAKING: Stable/uncomplicated   EVALUATION COMPLEXITY: Low     GOALS:   SHORT TERM GOALS:   STG Name Target Date Goal status  1 Brianna Haley will be >75% HEP compliant to improve carryover between sessions and facilitate independent management of condition   Baseline: No HEP  12/25/2021 INITIAL    LONG TERM GOALS:    LTG Name Target Date Goal status  1 Brianna Haley will improve the following MMTs  to >/= 4/5 to show improvement in strength:  mid and lower trap    Baseline:    Middle trapezius 3+ 3*  Lower trapezius 3+ 3+   01/29/2022 INITIAL  2 Brianna Haley will report >/= 50% decrease in pain from evaluation    Baseline: 8/10 max pain 01/29/2022 INITIAL  3 Brianna Haley will be able to lift 45 lbs from floor to waist height, not limited by pain   Baseline: unable 01/29/2022 INITIAL    PLAN: PT FREQUENCY: 1-2x/week   PT DURATION: 8 weeks (Ending 01/29/2022)   PLANNED INTERVENTIONS: Therapeutic exercises, Therapeutic activity, Neuro Muscular re-education, Gait training, Patient/Family education, Joint mobilization, Dry Needling, Electrical stimulation, Spinal mobilization and/or manipulation, Moist heat, Taping, Vasopneumatic device, Ionotophoresis 4mg /ml Dexamethasone, and Manual therapy   PLAN FOR NEXT SESSION: Progress cervical and periscapular strengthening, neural glides, progressive UE strengthening         Vanessa Pope, PT, DPT 12/12/21 12:11 PM

## 2021-12-12 ENCOUNTER — Ambulatory Visit: Payer: Medicaid Other

## 2021-12-12 ENCOUNTER — Other Ambulatory Visit: Payer: Self-pay

## 2021-12-12 DIAGNOSIS — M6281 Muscle weakness (generalized): Secondary | ICD-10-CM

## 2021-12-12 DIAGNOSIS — M542 Cervicalgia: Secondary | ICD-10-CM | POA: Diagnosis not present

## 2021-12-17 ENCOUNTER — Other Ambulatory Visit: Payer: Self-pay

## 2021-12-17 ENCOUNTER — Ambulatory Visit: Payer: Medicaid Other

## 2021-12-17 DIAGNOSIS — M542 Cervicalgia: Secondary | ICD-10-CM

## 2021-12-17 DIAGNOSIS — M6281 Muscle weakness (generalized): Secondary | ICD-10-CM | POA: Diagnosis not present

## 2021-12-17 NOTE — Therapy (Addendum)
OUTPATIENT PHYSICAL THERAPY TREATMENT NOTE   Patient Name: Brianna Haley MRN: [id number] DOB:11/16/1991, 31 y.o., female Today's Date: 12/17/2021  PCP: Grayce SessionsEdwards, Michelle P, NP REFERRING PROVIDER: Eldred MangesYates, Mark C, MD   PT End of Session - 12/17/21 0915     Visit Number 3    Number of Visits 16    Date for PT Re-Evaluation 01/29/22    Authorization Type [employer]    Authorization Time Period 12/12/2021-02/10/2022    Authorization - Visit Number 2    Authorization - Number of Visits 12    PT Start Time 0915    PT Stop Time 1000    PT Time Calculation (min) 45 min    Activity Tolerance Patient tolerated treatment well    Behavior During Therapy WFL for tasks assessed/performed              Past Medical History:  Diagnosis Date   Abdominal pain    Body aches    Chest discomfort    Constipation    Cough    Fatigue    Feeling of sadness    Fever    GERD (gastroesophageal reflux disease)    Helicobacter pylori antibody positive    History of spontaneous abortion    Language barrier, cultural differences    Malaise    Maternal varicella, non-immune    RBBB    Sinusitis chronic, frontal    SOB (shortness of breath)    Vitamin D insufficiency 01/12/2020   History reviewed. No pertinent surgical history. Patient Active Problem List   Diagnosis Date Noted   IUD check up 10/04/2021   Encounter for insertion of mirena IUD 09/12/2021   Language barrier 12/04/2016    REFERRING DIAG: Neck pain [M54.2]  THERAPY DIAG:  Cervicalgia  Muscle weakness  PERTINENT HISTORY: None   SUBJECTIVE: Pt reports 3-4/10 pain today. She reports that it is normal for her pain to come and go and adds that today's pain isn't too bad. She reports that she has not been able to do her HEP due to her baby not feeling well.  PAIN:  Are you having pain? Yes NPRS scale: 3-4/10 Pain location: Neck Pain orientation: Right PAIN TYPE: aching Pain description: intermittent      OBJECTIVE:    *Unless otherwise noted, objective information collected previously* GENERAL OBSERVATION:          Slight rounded shoulders; forward head                               SENSATION:          Light touch: Appears intact           PALPATION: Diffuse myofacial tenderness bil sub occipitals, cx paraspinals, UT, and bil LS   Cervical ROM   ROM ROM  12/04/2021  Flexion N  Extension N  Right lateral flexion *pulling on L  Left lateral flexion N  Right rotation N  Left rotation N  Flexion rotation (normal is 30 degrees)    Flexion rotation (normal is 30 degrees)      (Blank rows = not tested n=normal)     UPPER EXTREMITY MMT:   MMT Right 12/04/2021 Left 12/04/2021  Shoulder flexion 4 (5/5 on 12/17/2021) 4*forearm (5/5 on 12/17/2021)  Shoulder abduction (C5) 4 (5/5 with mild neck pain on 12/17/2021) 4*forearm (5/5 on 12/17/2021)  Shoulder ER N N  Shoulder IR      Middle trapezius 3+/5, 4/5 (  12/17/2021) 3*/5, 4/5 (12/17/2021)  Lower trapezius 3+/5, 4/5 (12/17/2021) 3+/5, 4/5 (12/17/2021)  Shoulder extension  4/5 (12/17/2021)  4/5 (12/17/2021)  Grip strength      Cervical flexion (C1,C2)      Cervical S/B (C3)      Shoulder shrug (C4) n n  Elbow flexion (C6) n n  Elbow ext (C7) n n  Thumb ext (C8) n n  Finger abd (T1) n n    (Blank rows = not tested, score listed is out of 5 possible points.  N = WNL, D = diminished, * = concordant pain with testing)     SPECIAL TESTS:           Spurlings: (-) bil           Hoffman's: (-) bil           ULTT A: (+) L NT R              JOINT MOBILITY TESTING:  Not today   PATIENT SURVEYS:  none              TODAY'S TREATMENT:   [facility] Adult PT Treatment:                                                DATE: 12/17/2021 Therapeutic Exercise: Seated low rows with 30# cable 2x10 Seated high rows with 30# cable 2x10 Seated lat pull-down with 30# cable 2x10 Lower trap lift-off at wall with YTB around wrists 2x10 Fwd/backward shoulder rolls 2x10 each  in-between lower trap lift-off sets PNF D2 shoulder flexion with YTB 2x8 BIL Push-up plus with hands on table, feet on floor (alternating scapular retraction/protraction) 3x15 Upper trap stretch 2x36min BIL Levator scap stretch 2x1 min BIL Manual Therapy: Supine manual cervical distraction x2 minutes Supine suboccipital release x6 minutes Prone grade V CT junction manipulation x1 BIL with cavitation Prone thoracic grade V manipulation x4 throughout with cavitation Neuromuscular re-ed: N/A Therapeutic Activity: N/A Modalities: N/A Self Care: N/A   [facility] Adult PT Treatment:                                                DATE: 12/12/2021 Therapeutic Exercise: Seated low rows with 25# cable 2x10 Seated high rows with 25# cable 2x10 Seated lat pull-down with 25# cable 2x10 Low trap lift-off at wall 3x10 Supine DNF endurance with head lift/ chin tuck 4x to exhaustion Push-up plus (alternating scapular retraction/protraction) 3x15 Cervical rotation isotonic contractions 2x10 BIL with 5-sec force production Manual Therapy: Prone grade V CT junction manipulation x1 BIL with cavitation Prone thoracic grade V manipulation x4 throughout with cavitation Prone effleurage to BIL cervical paraspinals Neuromuscular re-ed: N/A Therapeutic Activity: N/A Modalities: N/A Self Care: N/A      PATIENT EDUCATION:  Pt educated on proper form with updated HEP.     HOME EXERCISE PROGRAM: Access Code: [email or web address] URL: [email or web address] Date: 12/04/2021 Prepared by: Alphonzo Severance   Exercises Median Nerve Glide - 2 x daily - 7 x weekly - 3 sets - 10 reps Supine Shoulder Horizontal Abduction with Resistance - 1 x daily - 7 x weekly - 3 sets - 10 reps Supine Cervical Retraction with Towel - 2 x daily - 7  x weekly - 3 sets - 10 reps  Added 12/12/2021: Low trap slides at wall with lift-off - 1 x daily - 7 x weekly - 3 sets - 10 reps Standing Shoulder Row with Anchored  Resistance - 1 x daily - 7 x weekly - 3 sets - 10 reps - 3-sec hold     ASSESSMENT:   CLINICAL IMPRESSION: Pt responded well to all interventions today, demonstrating good form and no increase in pain with selected interventions. Of note, she reports improved pain from 4/10 to 2/10 following CT junction manipulation and reports that this has been the most helpful intervention to date. Additionally, she demonstrates improved functional strength as indicated by improved MMT and ability to perform strengthening exercises with increased resistance. She will continue to benefit from skilled PT to address her primary impairments and return to her prior level of function with less limitation.      REHAB POTENTIAL: Good   CLINICAL DECISION MAKING: Stable/uncomplicated   EVALUATION COMPLEXITY: Low     GOALS:   SHORT TERM GOALS:   STG Name Target Date Goal status  1 Kayana will be >75% HEP compliant to improve carryover between sessions and facilitate independent management of condition   Baseline: No HEP 12/25/2021 INITIAL    LONG TERM GOALS:    LTG Name Target Date Goal status  1 Aideliz will improve the following MMTs to >/= 4/5 to show improvement in strength:  mid and lower trap    Baseline: Improved to 4/5 on 12/17/2021 01/29/2022 Achieved  2 Don will report >/= 50% decrease in pain from evaluation    Baseline: 8/10 max pain at eval, 4/10 current  01/29/2022 Achieved  3 Nixie will be able to lift 45 lbs from floor to waist height, not limited by pain   Baseline: unable 01/29/2022 INITIAL    PLAN: PT FREQUENCY: 1-2x/week   PT DURATION: 8 weeks (Ending 01/29/2022)   PLANNED INTERVENTIONS: Therapeutic exercises, Therapeutic activity, Neuro Muscular re-education, Gait training, Patient/Family education, Joint mobilization, Dry Needling, Electrical stimulation, Spinal mobilization and/or manipulation, Moist heat, Taping, Vasopneumatic device, Ionotophoresis 4mg /ml Dexamethasone, and Manual  therapy   PLAN FOR NEXT SESSION: Progress cervical and periscapular strengthening, neural glides, progressive UE strengthening         , PT, DPT 12/17/21 9:58 AM

## 2021-12-19 ENCOUNTER — Ambulatory Visit: Payer: Medicaid Other | Admitting: Physical Therapy

## 2021-12-24 ENCOUNTER — Ambulatory Visit: Payer: Medicaid Other | Admitting: Physical Therapy

## 2021-12-26 ENCOUNTER — Ambulatory Visit: Payer: Medicaid Other | Admitting: Physical Therapy

## 2021-12-26 ENCOUNTER — Encounter: Payer: Self-pay | Admitting: Physical Therapy

## 2021-12-26 ENCOUNTER — Other Ambulatory Visit: Payer: Self-pay

## 2021-12-26 DIAGNOSIS — M6281 Muscle weakness (generalized): Secondary | ICD-10-CM

## 2021-12-26 DIAGNOSIS — M542 Cervicalgia: Secondary | ICD-10-CM

## 2021-12-26 NOTE — Therapy (Signed)
OUTPATIENT PHYSICAL THERAPY TREATMENT NOTE   Patient Name: Brianna Haley MRN: [id number] DOB:Sep 14, 1991, 31 y.o., female Today's Date: 12/26/2021  PCP: Kerin Perna, NP REFERRING PROVIDER: Marybelle Killings, MD   PT End of Session - 12/26/21 0914     Visit Number 4    Number of Visits 12    Date for PT Re-Evaluation 01/29/22    Authorization Type [employer]    Authorization Time Period 12/12/2021-02/10/2022    Authorization - Visit Number 2    Authorization - Number of Visits 12    PT Start Time 0915    PT Stop Time 0958    PT Time Calculation (min) 43 min    Activity Tolerance Patient tolerated treatment well    Behavior During Therapy [facility] for tasks assessed/performed              Past Medical History:  Diagnosis Date   Abdominal pain    Body aches    Chest discomfort    Constipation    Cough    Fatigue    Feeling of sadness    Fever    GERD (gastroesophageal reflux disease)    Helicobacter pylori antibody positive    History of spontaneous abortion    Language barrier, cultural differences    Malaise    Maternal varicella, non-immune    RBBB    Sinusitis chronic, frontal    SOB (shortness of breath)    Vitamin D insufficiency 01/12/2020   History reviewed. No pertinent surgical history. Patient Active Problem List   Diagnosis Date Noted   IUD check up 10/04/2021   Encounter for insertion of mirena IUD 09/12/2021   Language barrier 12/04/2016    REFERRING DIAG: Neck pain [M54.2]  THERAPY DIAG:  Cervicalgia  Muscle weakness  PERTINENT HISTORY: None   SUBJECTIVE: Pt reports that she is having some pain at the base of her neck today, but that she feels overall her pain is improving  PAIN:  Are you having pain? Yes NPRS scale: 4/10 Pain location: Neck Pain orientation: Right PAIN TYPE: aching Pain description: intermittent    OBJECTIVE:   *Unless otherwise noted, objective information collected previously* GENERAL OBSERVATION:           Slight rounded shoulders; forward head                               SENSATION:          Light touch: Appears intact           PALPATION: Diffuse myofacial tenderness bil sub occipitals, cx paraspinals, UT, and bil LS   Cervical ROM   ROM ROM  12/04/2021  Flexion N  Extension N  Right lateral flexion *pulling on L  Left lateral flexion N  Right rotation N  Left rotation N  Flexion rotation (normal is 30 degrees)    Flexion rotation (normal is 30 degrees)      (Blank rows = not tested n=normal)     UPPER EXTREMITY MMT:   MMT Right 12/04/2021 Left 12/04/2021  Shoulder flexion 4 (5/5 on 12/17/2021) 4*forearm (5/5 on 12/17/2021)  Shoulder abduction (C5) 4 (5/5 with mild neck pain on 12/17/2021) 4*forearm (5/5 on 12/17/2021)  Shoulder ER N N  Shoulder IR      Middle trapezius 3+/5, 4/5 (12/17/2021) 3*/5, 4/5 (12/17/2021)  Lower trapezius 3+/5, 4/5 (12/17/2021) 3+/5, 4/5 (12/17/2021)  Shoulder extension  4/5 (12/17/2021)  4/5 (12/17/2021)  Grip strength      Cervical flexion (C1,C2)      Cervical S/B (C3)      Shoulder shrug (C4) n n  Elbow flexion (C6) n n  Elbow ext (C7) n n  Thumb ext (C8) n n  Finger abd (T1) n n    (Blank rows = not tested, score listed is out of 5 possible points.  N = WNL, D = diminished, * = concordant pain with testing)     SPECIAL TESTS:           Spurlings: (-) bil           Hoffman's: (-) bil           ULTT A: (+) L NT R              JOINT MOBILITY TESTING:  Not today   PATIENT SURVEYS:  none              TODAY'S TREATMENT:   [facility] Adult PT Treatment: Therapeutic Exercise:  UBE 2.5'/2.5' fwd and backward for warm up while taking subjective Lateral band walk on wall - YTB - 4 laps Lower trap lift-off at wall with YTB around wrists 2x10 Seated low rows with 30# cable 3x10 Seated lat pull-down with 30# cable 2x10 Bil ER with YTB + scapular retraction Farmers carry - 15# ea hand 360' OH press KB bottom up 2x10 ea - 5# Prone T and Y 2x 10  ea  Manual Therapy: Supine manual cervical distraction Supine suboccipital release Prone thoracic grade V manipulation     PATIENT EDUCATION:  Pt educated on proper form with updated HEP.     HOME EXERCISE PROGRAM: Access Code: [email or web address] URL: [email or web address] Date: 12/04/2021 Prepared by: Shearon Balo   Exercises Median Nerve Glide - 2 x daily - 7 x weekly - 3 sets - 10 reps Supine Shoulder Horizontal Abduction with Resistance - 1 x daily - 7 x weekly - 3 sets - 10 reps Supine Cervical Retraction with Towel - 2 x daily - 7 x weekly - 3 sets - 10 reps  Added 12/12/2021: Low trap slides at wall with lift-off - 1 x daily - 7 x weekly - 3 sets - 10 reps Standing Shoulder Row with Anchored Resistance - 1 x daily - 7 x weekly - 3 sets - 10 reps - 3-sec hold     ASSESSMENT:   CLINICAL IMPRESSION: Brianna Haley is progressing well with therapy.  Pt reports significant pain reduction following therapy (>/= 2 pts NPRS).  Today we concentrated on cervico-scapulothoracic strengthening.  Pt with strength deficit L>R with lat pull down.  She responds well to therex + manual with reduction in pain to 0/10.  Pt will continue to benefit from skilled physical therapy to address remaining deficits and achieve listed goals.  Continue per POC.     GOALS:   SHORT TERM GOALS:   STG Name Target Date Goal status  1 Brianna Haley will be >75% HEP compliant to improve carryover between sessions and facilitate independent management of condition   Baseline: No HEP 12/25/2021 MET 1/26    LONG TERM GOALS:    LTG Name Target Date Goal status  1 Brianna Haley will improve the following MMTs to >/= 4/5 to show improvement in strength:  mid and lower trap    Baseline: Improved to 4/5 on 12/17/2021 01/29/2022 Achieved  2 Brianna Haley will report >/= 50% decrease in pain from evaluation    Baseline: 8/10 max pain at  eval, 4/10 current  01/29/2022 Achieved  3 Brianna Haley will be able to lift 45 lbs from floor to waist  height, not limited by pain   Baseline: unable 01/29/2022 INITIAL    PLAN: PT FREQUENCY: 1-2x/week   PT DURATION: 8 weeks (Ending 01/29/2022)   PLANNED INTERVENTIONS: Therapeutic exercises, Therapeutic activity, Neuro Muscular re-education, Gait training, Patient/Family education, Joint mobilization, Dry Needling, Electrical stimulation, Spinal mobilization and/or manipulation, Moist heat, Taping, Vasopneumatic device, Ionotophoresis 29m/ml Dexamethasone, and Manual therapy   PLAN FOR NEXT SESSION: Progress cervical and periscapular strengthening, neural glides, progressive UE    KKevan NyReinhartsen PT 12/26/21 9:45 AM

## 2021-12-30 ENCOUNTER — Other Ambulatory Visit: Payer: Self-pay

## 2021-12-30 ENCOUNTER — Ambulatory Visit (INDEPENDENT_AMBULATORY_CARE_PROVIDER_SITE_OTHER): Payer: Medicaid Other | Admitting: Primary Care

## 2021-12-30 ENCOUNTER — Encounter (INDEPENDENT_AMBULATORY_CARE_PROVIDER_SITE_OTHER): Payer: Self-pay | Admitting: Primary Care

## 2021-12-30 VITALS — BP 113/72 | HR 80 | Temp 98.1°F | Ht 64.0 in | Wt 123.2 lb

## 2021-12-30 DIAGNOSIS — K2100 Gastro-esophageal reflux disease with esophagitis, without bleeding: Secondary | ICD-10-CM | POA: Diagnosis not present

## 2021-12-30 DIAGNOSIS — R1013 Epigastric pain: Secondary | ICD-10-CM | POA: Diagnosis not present

## 2021-12-30 DIAGNOSIS — R5383 Other fatigue: Secondary | ICD-10-CM

## 2021-12-30 DIAGNOSIS — E559 Vitamin D deficiency, unspecified: Secondary | ICD-10-CM | POA: Diagnosis not present

## 2021-12-30 MED ORDER — OMEPRAZOLE 20 MG PO CPDR: 20.0000 mg | DELAYED_RELEASE_CAPSULE | Freq: Every day | ORAL | 3 refills | Status: DC

## 2021-12-30 NOTE — Progress Notes (Signed)
[facility]  Brianna Haley, is a 31 y.o. female  ESP:[id number]  QJF:[id number]  DOB - [birth year]  Chief Complaint  Patient presents with   Abdominal Pain    cramping   Fatigue   Chest Pain       Subjective:   [name] is a 31 y.o. [name] female Brianna Haley [phone]) here today for abdominal pain she is on her menstrual cycle . Typically she has no pain . She feels stomach pain is from eating the wrong food or food poisoning.No red flags present. Diet discussed. Avoid fried, spicy, fatty, greasy, and acidic foods. She denies caffeine, nicotine, and alcohol. Do not eat 2-3 hours before bedtime and stay upright for at least 1-2 hours after eating. Eat small frequent meals. Avoid NSAID's like motrin and aleve.   Patient has No headache, No chest pain,  - No Nausea, No new weakness tingling or numbness, No Cough - SOB. She is complaining of being tired and fatigue had a hx of vitamin D def and would like it checked.  No problems updated.  ALLERGIES: No Known Allergies  PAST MEDICAL HISTORY: Past Medical History:  Diagnosis Date   Abdominal pain    Body aches    Chest discomfort    Constipation    Cough    Fatigue    Feeling of sadness    Fever    GERD (gastroesophageal reflux disease)    Helicobacter pylori antibody positive    History of spontaneous abortion    Language barrier, cultural differences    Malaise    Maternal varicella, non-immune    RBBB    Sinusitis chronic, frontal    SOB (shortness of breath)    Vitamin D insufficiency 01/12/2020    MEDICATIONS AT HOME: Prior to Admission medications   Medication Sig Start Date End Date Taking? Authorizing Provider  esomeprazole (NEXIUM) 20 MG capsule Take 1 capsule by mouth once daily 11/22/21   Grayce Sessions, NP  ibuprofen (ADVIL) 600 MG tablet Take 1 tablet (600 mg total) by mouth every 6 (six) hours as needed. 10/17/21   Grayce Sessions, NP  tiZANidine (ZANAFLEX) 4 MG tablet Take 1 tablet  (4 mg total) by mouth every 8 (eight) hours as needed for muscle spasms. 10/17/21   Grayce Sessions, NP    Objective:  Comprehensive ROS Pertinent positive and negative noted in HPI   Vitals:   12/30/21 1008  BP: 113/72  Pulse: 80  Temp: 98.1 F (36.7 C)  TempSrc: Oral  SpO2: 97%  Weight: 123 lb 3.2 oz (55.9 kg)  Height: 5\' 4"  (1.626 m)   Exam General appearance : Awake, alert, not in any distress. Speech Clear. Not toxic looking HEENT: Atraumatic and Normocephalic, pupils equally reactive to light and accomodation Neck: Supple, no JVD. No cervical lymphadenopathy.  Chest: Good air entry bilaterally, no added sounds  CVS: S1 S2 regular, no murmurs.  Abdomen: Bowel sounds present, Non tender and not distended with no gaurding, rigidity or rebound. Extremities: B/L Lower Ext shows no edema, both legs are warm to touch Neurology: Awake alert, and oriented X 3, CN II-XII intact, Non focal Skin: No Rash  Data Review No results found for: HGBA1C  Assessment & Plan  Raye was seen today for abdominal pain, fatigue and chest pain.  Diagnoses and all orders for this visit:  Gastroesophageal reflux disease with esophagitis without hemorrhage Discussed eating small frequent meal, reduction in acidic foods, fried foods ,spicy foods, alcohol  caffeine and tobacco and certain medications. Avoid laying down after eating 30mins-1hour, elevated head of the bed.   Epigastric pain/abdominal pain Menstrual cramping can take ibuprofen 200mg  (OTC)  Fatigue, unspecified type -     CBC with Differential -     TSH + free T4  Vitamin D deficiency Vitamin D is needed to make and keep bones strong.  -     Vitamin D, 25-hydroxy CMP     Patient have been counseled extensively about nutrition and exercise. Other issues discussed during this visit include: low cholesterol diet, weight control and daily exercise, foot care, annual eye examinations at Ophthalmology, importance of adherence with  medications and regular follow-up. We also discussed long term complications of uncontrolled diabetes and hypertension.   No follow-ups on file.  The patient was given clear instructions to go to ER or return to medical center if symptoms don't improve, worsen or new problems develop. The patient verbalized understanding. The patient was told to call to get lab results if they haven't heard anything in the next week.   This note has been created with . Any transcriptional errors are unintentional.   Education officer, environmental, NP 12/30/2021, 10:32 AM

## 2021-12-31 ENCOUNTER — Encounter: Payer: Self-pay | Admitting: Physical Therapy

## 2021-12-31 ENCOUNTER — Ambulatory Visit: Payer: Medicaid Other | Admitting: Physical Therapy

## 2021-12-31 DIAGNOSIS — M542 Cervicalgia: Secondary | ICD-10-CM | POA: Diagnosis not present

## 2021-12-31 DIAGNOSIS — M6281 Muscle weakness (generalized): Secondary | ICD-10-CM | POA: Diagnosis not present

## 2021-12-31 LAB — CBC WITH DIFFERENTIAL/PLATELET
Basophils Absolute: 0.1 10*3/uL (ref 0.0–0.2)
Basos: 1 %
EOS (ABSOLUTE): 0.2 10*3/uL (ref 0.0–0.4)
Eos: 4 %
Hematocrit: 40.2 % (ref 34.0–46.6)
Hemoglobin: 13.2 g/dL (ref 11.1–15.9)
Immature Grans (Abs): 0 10*3/uL (ref 0.0–0.1)
Immature Granulocytes: 0 %
Lymphocytes Absolute: 1.9 10*3/uL (ref 0.7–3.1)
Lymphs: 43 %
MCH: 29.7 pg (ref 26.6–33.0)
MCHC: 32.8 g/dL (ref 31.5–35.7)
MCV: 90 fL (ref 79–97)
Monocytes Absolute: 0.3 10*3/uL (ref 0.1–0.9)
Monocytes: 7 %
Neutrophils Absolute: 2.1 10*3/uL (ref 1.4–7.0)
Neutrophils: 45 %
Platelets: 341 10*3/uL (ref 150–450)
RBC: 4.45 x10E6/uL (ref 3.77–5.28)
RDW: 12.4 % (ref 11.7–15.4)
WBC: 4.6 10*3/uL (ref 3.4–10.8)

## 2021-12-31 LAB — CMP14+EGFR
ALT: 20 IU/L (ref 0–32)
AST: 28 IU/L (ref 0–40)
Albumin/Globulin Ratio: 1.9 (ref 1.2–2.2)
Albumin: 5 g/dL (ref 3.9–5.0)
Alkaline Phosphatase: 67 IU/L (ref 44–121)
BUN/Creatinine Ratio: 23 (ref 9–23)
BUN: 15 mg/dL (ref 6–20)
Bilirubin Total: 0.7 mg/dL (ref 0.0–1.2)
CO2: 23 mmol/L (ref 20–29)
Calcium: 10 mg/dL (ref 8.7–10.2)
Chloride: 104 mmol/L (ref 96–106)
Creatinine, Ser: 0.66 mg/dL (ref 0.57–1.00)
Globulin, Total: 2.7 g/dL (ref 1.5–4.5)
Glucose: 63 mg/dL — ABNORMAL LOW (ref 70–99)
Potassium: 4.4 mmol/L (ref 3.5–5.2)
Sodium: 140 mmol/L (ref 134–144)
Total Protein: 7.7 g/dL (ref 6.0–8.5)
eGFR: 122 mL/min/{1.73_m2} (ref >59)

## 2021-12-31 LAB — TSH+FREE T4
Free T4: 1.13 ng/dL (ref 0.82–1.77)
TSH: 2.3 u[IU]/mL (ref 0.450–4.500)

## 2021-12-31 LAB — VITAMIN D 25 HYDROXY (VIT D DEFICIENCY, FRACTURES): Vit D, 25-Hydroxy: 19 ng/mL — ABNORMAL LOW (ref 30.0–100.0)

## 2021-12-31 NOTE — Therapy (Signed)
OUTPATIENT PHYSICAL THERAPY TREATMENT NOTE   Patient Name: Hadlie Gipson MRN: [id number] DOB:Aug 22, 1991, 31 y.o., female Today's Date: 12/31/2021  PCP: Kerin Perna, NP REFERRING PROVIDER: Marybelle Killings, MD   PT End of Session - 12/31/21 [id number]     Visit Number 5    Number of Visits 12    Date for PT Re-Evaluation 01/29/22    Authorization Type [employer]    Authorization Time Period 12/12/2021-02/10/2022    Authorization - Visit Number 5    Authorization - Number of Visits 12    PT Start Time 1000    PT Stop Time 1042    PT Time Calculation (min) 42 min    Activity Tolerance Patient tolerated treatment well    Behavior During Therapy WFL for tasks assessed/performed              Past Medical History:  Diagnosis Date   Abdominal pain    Body aches    Chest discomfort    Constipation    Cough    Fatigue    Feeling of sadness    Fever    GERD (gastroesophageal reflux disease)    Helicobacter pylori antibody positive    History of spontaneous abortion    Language barrier, cultural differences    Malaise    Maternal varicella, non-immune    RBBB    Sinusitis chronic, frontal    SOB (shortness of breath)    Vitamin D insufficiency 01/12/2020   History reviewed. No pertinent surgical history. Patient Active Problem List   Diagnosis Date Noted   IUD check up 10/04/2021   Encounter for insertion of mirena IUD 09/12/2021   Language barrier 12/04/2016    REFERRING DIAG: Neck pain [M54.2]  THERAPY DIAG:  Cervicalgia  Muscle weakness  PERTINENT HISTORY: None   SUBJECTIVE:   Pt reports overall improvement with reduced instances of pain.  PAIN:  Are you having pain? Yes NPRS scale: 2-3/10 Pain location: Neck Pain orientation: Right PAIN TYPE: aching Pain description: intermittent    OBJECTIVE:   *Unless otherwise noted, objective information collected previously* GENERAL OBSERVATION:          Slight rounded shoulders; forward head                                SENSATION:          Light touch: Appears intact           PALPATION: Diffuse myofacial tenderness bil sub occipitals, cx paraspinals, UT, and bil LS   Cervical ROM   ROM ROM  12/04/2021  Flexion N  Extension N  Right lateral flexion *pulling on L  Left lateral flexion N  Right rotation N  Left rotation N  Flexion rotation (normal is 30 degrees)    Flexion rotation (normal is 30 degrees)      (Blank rows = not tested n=normal)     UPPER EXTREMITY MMT:   MMT Right 12/04/2021 Left 12/04/2021  Shoulder flexion 4 (5/5 on 12/17/2021) 4*forearm (5/5 on 12/17/2021)  Shoulder abduction (C5) 4 (5/5 with mild neck pain on 12/17/2021) 4*forearm (5/5 on 12/17/2021)  Shoulder ER N N  Shoulder IR      Middle trapezius 3+/5, 4/5 (12/17/2021) 3*/5, 4/5 (12/17/2021)  Lower trapezius 3+/5, 4/5 (12/17/2021) 3+/5, 4/5 (12/17/2021)  Shoulder extension  4/5 (12/17/2021)  4/5 (12/17/2021)  Grip strength      Cervical flexion (C1,C2)  Cervical S/B (C3)      Shoulder shrug (C4) n n  Elbow flexion (C6) n n  Elbow ext (C7) n n  Thumb ext (C8) n n  Finger abd (T1) n n    (Blank rows = not tested, score listed is out of 5 possible points.  N = WNL, D = diminished, * = concordant pain with testing)     SPECIAL TESTS:           Spurlings: (-) bil           Hoffman's: (-) bil           ULTT A: (+) L NT R              JOINT MOBILITY TESTING:  Not today   PATIENT SURVEYS:  none              TODAY'S TREATMENT:   [facility] Adult PT Treatment: Therapeutic Exercise:  Elliptical - 5 min for warm up while taking subjective Lateral then vertical band walk on wall - RTB - 4 laps Lower trap lift-off at wall with RTB around wrists 2x10 Seated low rows with 35# cable 3x10 Seated lat pull-down with 35# cable 3x10 (hold) Bil ER with RTB + scapular retraction Farmers carry - 15# ea hand 360' OH press KB bottom up 3x10 ea - 5# Prone T and Y 3x 10 ea  Manual Therapy: Supine manual cervical  distraction Supine suboccipital release Prone thoracic grade V manipulation Cervical grade 5 w/o cavitation     HOME EXERCISE PROGRAM: Access Code: [email or web address] URL: [email or web address] Date: 12/04/2021 Prepared by: Shearon Balo   Exercises Median Nerve Glide - 2 x daily - 7 x weekly - 3 sets - 10 reps Supine Shoulder Horizontal Abduction with Resistance - 1 x daily - 7 x weekly - 3 sets - 10 reps Supine Cervical Retraction with Towel - 2 x daily - 7 x weekly - 3 sets - 10 reps  Added 12/12/2021: Low trap slides at wall with lift-off - 1 x daily - 7 x weekly - 3 sets - 10 reps Standing Shoulder Row with Anchored Resistance - 1 x daily - 7 x weekly - 3 sets - 10 reps - 3-sec hold     ASSESSMENT:   CLINICAL IMPRESSION: Tahiry is progressing well with therapy.  Pt reports significant pain reduction following therapy (>/= 2 pts NPRS).  Today we concentrated on cervico-scapulothoracic strengthening.  Pt with continued strength and endurance progression as expected; continue current course.  Pt will continue to benefit from skilled physical therapy to address remaining deficits and achieve listed goals.  Continue per POC.     GOALS:   SHORT TERM GOALS:   STG Name Target Date Goal status  1 Fernande will be >75% HEP compliant to improve carryover between sessions and facilitate independent management of condition   Baseline: No HEP 12/25/2021 MET 1/26    LONG TERM GOALS:    LTG Name Target Date Goal status  1 Earnestene will improve the following MMTs to >/= 4/5 to show improvement in strength:  mid and lower trap    Baseline: Improved to 4/5 on 12/17/2021 01/29/2022 Achieved  2 Carolin will report >/= 50% decrease in pain from evaluation    Baseline: 8/10 max pain at eval, 4/10 current  01/29/2022 Achieved  3 Kawanda will be able to lift 45 lbs from floor to waist height, not limited by pain   Baseline: unable 01/29/2022 INITIAL  PLAN: PT FREQUENCY: 1-2x/week   PT DURATION:  8 weeks (Ending 01/29/2022)   PLANNED INTERVENTIONS: Therapeutic exercises, Therapeutic activity, Neuro Muscular re-education, Gait training, Patient/Family education, Joint mobilization, Dry Needling, Electrical stimulation, Spinal mobilization and/or manipulation, Moist heat, Taping, Vasopneumatic device, Ionotophoresis 35m/ml Dexamethasone, and Manual therapy   PLAN FOR NEXT SESSION: Progress cervical and periscapular strengthening, neural glides, progressive UE    KKevan NyReinhartsen PT 12/31/21 9:59 AM

## 2022-01-01 DIAGNOSIS — Z419 Encounter for procedure for purposes other than remedying health state, unspecified: Secondary | ICD-10-CM | POA: Diagnosis not present

## 2022-01-02 ENCOUNTER — Ambulatory Visit: Payer: Medicaid Other | Attending: Orthopaedic Surgery | Admitting: Physical Therapy

## 2022-01-02 ENCOUNTER — Encounter: Payer: Self-pay | Admitting: Physical Therapy

## 2022-01-02 ENCOUNTER — Other Ambulatory Visit: Payer: Self-pay

## 2022-01-02 DIAGNOSIS — M6281 Muscle weakness (generalized): Secondary | ICD-10-CM | POA: Insufficient documentation

## 2022-01-02 DIAGNOSIS — M542 Cervicalgia: Secondary | ICD-10-CM | POA: Insufficient documentation

## 2022-01-02 NOTE — Therapy (Signed)
OUTPATIENT PHYSICAL THERAPY TREATMENT NOTE   Patient Name: Brianna Haley MRN: [id number] DOB:1991/03/31, 31 y.o., female Today's Date: 01/02/2022  PCP: Kerin Perna, NP REFERRING PROVIDER: Marybelle Killings, MD   PT End of Session - 01/02/22 0913     Visit Number 6    Number of Visits 12    Date for PT Re-Evaluation 01/29/22    Authorization Type [employer]    Authorization Time Period 12/12/2021-02/10/2022    Authorization - Visit Number 6    Authorization - Number of Visits 12    PT Start Time 0915    PT Stop Time 0958    PT Time Calculation (min) 43 min    Activity Tolerance Patient tolerated treatment well    Behavior During Therapy [facility] for tasks assessed/performed              Past Medical History:  Diagnosis Date   Abdominal pain    Body aches    Chest discomfort    Constipation    Cough    Fatigue    Feeling of sadness    Fever    GERD (gastroesophageal reflux disease)    Helicobacter pylori antibody positive    History of spontaneous abortion    Language barrier, cultural differences    Malaise    Maternal varicella, non-immune    RBBB    Sinusitis chronic, frontal    SOB (shortness of breath)    Vitamin D insufficiency 01/12/2020   History reviewed. No pertinent surgical history. Patient Active Problem List   Diagnosis Date Noted   IUD check up 10/04/2021   Encounter for insertion of mirena IUD 09/12/2021   Language barrier 12/04/2016    REFERRING DIAG: Neck pain [M54.2]  THERAPY DIAG:  Cervicalgia  Muscle weakness  PERTINENT HISTORY: None   SUBJECTIVE:   Pt reports that her neck pain continues to improve.  She has been HEP compliant  PAIN:  Are you having pain? Yes NPRS scale: 4/10 Pain location: Neck Pain orientation: Right PAIN TYPE: aching Pain description: intermittent    OBJECTIVE:   *Unless otherwise noted, objective information collected previously* GENERAL OBSERVATION:          Slight rounded shoulders; forward head                                SENSATION:          Light touch: Appears intact           PALPATION: Diffuse myofacial tenderness bil sub occipitals, cx paraspinals, UT, and bil LS   Cervical ROM   ROM ROM  12/04/2021  Flexion N  Extension N  Right lateral flexion *pulling on L  Left lateral flexion N  Right rotation N  Left rotation N  Flexion rotation (normal is 30 degrees)    Flexion rotation (normal is 30 degrees)      (Blank rows = not tested n=normal)     UPPER EXTREMITY MMT:   MMT Right 12/04/2021 Left 12/04/2021  Shoulder flexion 4 (5/5 on 12/17/2021) 4*forearm (5/5 on 12/17/2021)  Shoulder abduction (C5) 4 (5/5 with mild neck pain on 12/17/2021) 4*forearm (5/5 on 12/17/2021)  Shoulder ER N N  Shoulder IR      Middle trapezius 3+/5, 4/5 (12/17/2021) 3*/5, 4/5 (12/17/2021)  Lower trapezius 3+/5, 4/5 (12/17/2021) 3+/5, 4/5 (12/17/2021)  Shoulder extension  4/5 (12/17/2021)  4/5 (12/17/2021)  Grip strength  Cervical flexion (C1,C2)      Cervical S/B (C3)      Shoulder shrug (C4) n n  Elbow flexion (C6) n n  Elbow ext (C7) n n  Thumb ext (C8) n n  Finger abd (T1) n n    (Blank rows = not tested, score listed is out of 5 possible points.  N = WNL, D = diminished, * = concordant pain with testing)     SPECIAL TESTS:           Spurlings: (-) bil           Hoffman's: (-) bil           ULTT A: (+) L NT R              JOINT MOBILITY TESTING:  Not today   PATIENT SURVEYS:  none              TODAY'S TREATMENT:   [facility] Adult PT Treatment: Therapeutic Exercise:  Elliptical - 5 min for warm up while taking subjective Lateral then vertical band walk on wall - RTB - 4 laps Lower trap lift-off at wall with RTB around wrists 2x10 Seated low rows with 40# cable 3x10 Seated lat pull-down with 35# cable 3x10 (hold) Bil ER with GTB + scapular retraction Farmers carry - 25# ea hand 180' (hold OH press KB bottom up 3x10 ea - 5# Plank on bosu with perturbations - 3 rounds Prone  T and Y 10'' holds with manual resistance - 5x ea  Manual Therapy: Supine suboccipital release Prone thoracic grade V manipulation Cervical grade 5 w/o cavitation Manual chin tuck with OP     HOME EXERCISE PROGRAM: Access Code: [email or web address] URL: [email or web address] Date: 12/04/2021 Prepared by: Shearon Balo   Exercises Median Nerve Glide - 2 x daily - 7 x weekly - 3 sets - 10 reps Supine Shoulder Horizontal Abduction with Resistance - 1 x daily - 7 x weekly - 3 sets - 10 reps Supine Cervical Retraction with Towel - 2 x daily - 7 x weekly - 3 sets - 10 reps  Added 12/12/2021: Low trap slides at wall with lift-off - 1 x daily - 7 x weekly - 3 sets - 10 reps Standing Shoulder Row with Anchored Resistance - 1 x daily - 7 x weekly - 3 sets - 10 reps - 3-sec hold     ASSESSMENT:   CLINICAL IMPRESSION: Brianna Haley is progressing well with therapy.  Pt reports mild pain reduction following therapy.  Today we concentrated on cervico-scapulothoracic strengthening.  Pt with L>R side weakness in periscapular muscles which is improving.  Will consider TDN for sub occipitals next visit.  Pt will continue to benefit from skilled physical therapy to address remaining deficits and achieve listed goals.  Continue per POC.     GOALS:   SHORT TERM GOALS:   STG Name Target Date Goal status  1 Brianna Haley will be >75% HEP compliant to improve carryover between sessions and facilitate independent management of condition   Baseline: No HEP 12/25/2021 MET 1/26    LONG TERM GOALS:    LTG Name Target Date Goal status  1 Brianna Haley will improve the following MMTs to >/= 4/5 to show improvement in strength:  mid and lower trap    Baseline: Improved to 4/5 on 12/17/2021 01/29/2022 Achieved  2 Brianna Haley will report >/= 50% decrease in pain from evaluation    Baseline: 8/10 max pain at eval, 4/10 current  01/29/2022 Achieved  3 Brianna Haley will be able to lift 45 lbs from floor to waist height, not limited by pain    Baseline: unable 01/29/2022 INITIAL    PLAN: PT FREQUENCY: 1-2x/week   PT DURATION: 8 weeks (Ending 01/29/2022)   PLANNED INTERVENTIONS: Therapeutic exercises, Therapeutic activity, Neuro Muscular re-education, Gait training, Patient/Family education, Joint mobilization, Dry Needling, Electrical stimulation, Spinal mobilization and/or manipulation, Moist heat, Taping, Vasopneumatic device, Ionotophoresis 18m/ml Dexamethasone, and Manual therapy   PLAN FOR NEXT SESSION: Progress cervical and periscapular strengthening, neural glides, progressive UE    KKevan NyReinhartsen PT 01/02/22 9:58 AM

## 2022-01-03 ENCOUNTER — Other Ambulatory Visit (INDEPENDENT_AMBULATORY_CARE_PROVIDER_SITE_OTHER): Payer: Self-pay | Admitting: Primary Care

## 2022-01-03 MED ORDER — VITAMIN D3 50 MCG (2000 UT) PO CAPS: 2000.0000 [IU] | ORAL_CAPSULE | Freq: Every day | ORAL | 1 refills | Status: AC

## 2022-01-03 MED ORDER — ERGOCALCIFEROL 1.25 MG (50000 UT) PO CAPS: 50000.0000 [IU] | ORAL_CAPSULE | ORAL | 0 refills | Status: AC

## 2022-01-07 ENCOUNTER — Ambulatory Visit: Payer: Medicaid Other | Admitting: Physical Therapy

## 2022-01-07 ENCOUNTER — Encounter: Payer: Self-pay | Admitting: Physical Therapy

## 2022-01-07 ENCOUNTER — Other Ambulatory Visit: Payer: Self-pay

## 2022-01-07 DIAGNOSIS — M6281 Muscle weakness (generalized): Secondary | ICD-10-CM

## 2022-01-07 DIAGNOSIS — M542 Cervicalgia: Secondary | ICD-10-CM | POA: Diagnosis not present

## 2022-01-07 NOTE — Therapy (Signed)
OUTPATIENT PHYSICAL THERAPY TREATMENT NOTE   Patient Name: Brianna Haley MRN: [id number] DOB:September 14, 1991, 31 y.o., female Today's Date: 01/07/2022  PCP: Brianna Perna, NP REFERRING PROVIDER: Kerin Perna, NP   PT End of Session - 01/07/22 0915     Visit Number 7    Number of Visits 12    Date for PT Re-Evaluation 01/29/22    Authorization Type [employer]    Authorization Time Period 12/12/2021-02/10/2022    Authorization - Visit Number 7    Authorization - Number of Visits 12    PT Start Time 0915    PT Stop Time 0958    PT Time Calculation (min) 43 min    Activity Tolerance Patient tolerated treatment well    Behavior During Therapy Brianna Haley for tasks assessed/performed              Past Medical History:  Diagnosis Date   Abdominal pain    Body aches    Chest discomfort    Constipation    Cough    Fatigue    Feeling of sadness    Fever    GERD (gastroesophageal reflux disease)    Helicobacter pylori antibody positive    History of spontaneous abortion    Language barrier, cultural differences    Malaise    Maternal varicella, non-immune    RBBB    Sinusitis chronic, frontal    SOB (shortness of breath)    Vitamin D insufficiency 01/12/2020   History reviewed. No pertinent surgical history. Patient Active Problem List   Diagnosis Date Noted   IUD check up 10/04/2021   Encounter for insertion of mirena IUD 09/12/2021   Language barrier 12/04/2016    REFERRING DIAG: Neck pain [M54.2]  THERAPY DIAG:  Cervicalgia  Muscle weakness  PERTINENT HISTORY: None   SUBJECTIVE:   Pt reports that she has had some increased pain over the weekend at times.  The pain is intermittent.  PAIN:  Are you having pain? Yes NPRS scale: 4/10 Pain location: Neck Pain orientation: Right PAIN TYPE: aching Pain description: intermittent    OBJECTIVE:   *Unless otherwise noted, objective information collected previously* GENERAL OBSERVATION:          Slight  rounded shoulders; forward head                               SENSATION:          Light touch: Appears intact           PALPATION: Diffuse myofacial tenderness bil sub occipitals, cx paraspinals, UT, and bil LS   Cervical ROM   ROM ROM  12/04/2021  Flexion N  Extension N  Right lateral flexion *pulling on L  Left lateral flexion N  Right rotation N  Left rotation N  Flexion rotation (normal is 30 degrees)    Flexion rotation (normal is 30 degrees)      (Blank rows = not tested n=normal)     UPPER EXTREMITY MMT:   MMT Right 12/04/2021 Left 12/04/2021  Shoulder flexion 4 (5/5 on 12/17/2021) 4*forearm (5/5 on 12/17/2021)  Shoulder abduction (C5) 4 (5/5 with mild neck pain on 12/17/2021) 4*forearm (5/5 on 12/17/2021)  Shoulder ER N N  Shoulder IR      Middle trapezius 3+/5, 4/5 (12/17/2021) 3*/5, 4/5 (12/17/2021)  Lower trapezius 3+/5, 4/5 (12/17/2021) 3+/5, 4/5 (12/17/2021)  Shoulder extension  4/5 (12/17/2021)  4/5 (12/17/2021)  Grip strength  Cervical flexion (C1,C2)      Cervical S/B (C3)      Shoulder shrug (C4) n n  Elbow flexion (C6) n n  Elbow ext (C7) n n  Thumb ext (C8) n n  Finger abd (T1) n n    (Blank rows = not tested, score listed is out of 5 possible points.  N = WNL, D = diminished, * = concordant pain with testing)     SPECIAL TESTS:           Spurlings: (-) bil           Hoffman's: (-) bil           ULTT A: (+) L NT R              JOINT MOBILITY TESTING:  Not today   PATIENT SURVEYS:  none              TODAY'S TREATMENT:   [facility] Adult PT Treatment: Therapeutic Exercise:  Elliptical - 5 min for warm up while taking subjective Seated low rows with 50# cable 2x10 Seated lat pull-down with 20# cable 2x10 (hold) Farmers carry - 25# ea hand 180' (hold) 1/2 kneeling chop - 10x ea _0 #  Manual Therapy: Supine suboccipital release Manual chin tuck with OP Skilled palpation for TDN  Trigger Point Dry-Needling  Treatment instructions: Expect mild  to moderate muscle soreness. S/S of pneumothorax if dry needled over a lung field, and to seek immediate medical attention should they occur. Patient verbalized understanding of these instructions and education.  Patient Consent Given: Yes Education handout provided: No Muscles treated: bil UT, bil sub occipitals Electrical stimulation performed: No Parameters: N/A Treatment response/outcome: Twitch response elicited and Palpable decrease in muscle tension      HOME EXERCISE PROGRAM: Access Code: [email or web address] URL: [email or web address] Date: 12/04/2021 Prepared by: Brianna Haley   Exercises Median Nerve Glide - 2 x daily - 7 x weekly - 3 sets - 10 reps Supine Shoulder Horizontal Abduction with Resistance - 1 x daily - 7 x weekly - 3 sets - 10 reps Supine Cervical Retraction with Towel - 2 x daily - 7 x weekly - 3 sets - 10 reps  Added 12/12/2021: Low trap slides at wall with lift-off - 1 x daily - 7 x weekly - 3 sets - 10 reps Standing Shoulder Row with Anchored Resistance - 1 x daily - 7 x weekly - 3 sets - 10 reps - 3-sec hold     ASSESSMENT:   CLINICAL IMPRESSION: Brianna Haley is progressing well with therapy.  Pt reports no increase in baseline pain following therapy.  Today we concentrated on cervico-scapulothoracic strengthening and pain reduction.  Pt tolerated TDN well, we will monitor for lasting response next visit.  Weight on lat pull down reduced to emphasize form.  Pt will continue to benefit from skilled physical therapy to address remaining deficits and achieve listed goals.  Continue per POC.     GOALS:   SHORT TERM GOALS:   STG Name Target Date Goal status  1 [name] will be >75% HEP compliant to improve carryover between sessions and facilitate independent management of condition   Baseline: No HEP 12/25/2021 MET 1/26    LONG TERM GOALS:    LTG Name Target Date Goal status  1 Brianna Haley will improve the following MMTs to >/= 4/5 to show improvement in  strength:  mid and lower trap    Baseline: Improved to 4/5 on 12/17/2021 01/29/2022 Achieved  2  Brianna Haley will report >/= 50% decrease in pain from evaluation    Baseline: 8/10 max pain at eval, 4/10 current  01/29/2022 Achieved  3 Brianna Haley will be able to lift 45 lbs from floor to waist height, not limited by pain   Baseline: unable 01/29/2022 INITIAL    PLAN: PT FREQUENCY: 1-2x/week   PT DURATION: 8 weeks (Ending 01/29/2022)   PLANNED INTERVENTIONS: Therapeutic exercises, Therapeutic activity, Neuro Muscular re-education, Gait training, Patient/Family education, Joint mobilization, Dry Needling, Electrical stimulation, Spinal mobilization and/or manipulation, Moist heat, Taping, Vasopneumatic device, Ionotophoresis 81m/ml Dexamethasone, and Manual therapy   PLAN FOR NEXT SESSION: Progress cervical and periscapular strengthening, neural glides, progressive UE    KKevan NyReinhartsen PT 01/07/22 9:57 AM

## 2022-01-09 ENCOUNTER — Encounter: Payer: Self-pay | Admitting: Physical Therapy

## 2022-01-09 ENCOUNTER — Telehealth (INDEPENDENT_AMBULATORY_CARE_PROVIDER_SITE_OTHER): Payer: Self-pay

## 2022-01-09 ENCOUNTER — Other Ambulatory Visit: Payer: Self-pay

## 2022-01-09 ENCOUNTER — Ambulatory Visit: Payer: Medicaid Other | Admitting: Physical Therapy

## 2022-01-09 DIAGNOSIS — M542 Cervicalgia: Secondary | ICD-10-CM

## 2022-01-09 DIAGNOSIS — M6281 Muscle weakness (generalized): Secondary | ICD-10-CM | POA: Diagnosis not present

## 2022-01-09 NOTE — Telephone Encounter (Signed)
-----   Message from Grayce Sessions, NP sent at 01/03/2022 12:58 PM EST ----- Vitamin D is needed to make and keep bones strong. The patient will need to take a prescription strength vitamin D tablet once weekly until next appointment.   I have sent the vitamin D tablet to the pharmacy for 12 weeks and it should be ready for pick up.   After completion start vitamin D3 2000iu daily labs re tested in 6 months,

## 2022-01-09 NOTE — Telephone Encounter (Signed)
Call placed to patient with the assistance of pacific interpreter [phone]). Patient is aware of low vitamin D and how to properly take both prescriptions. She has already picked them up. She scheduled vitamin D recheck for 05/30/2022. Maryjean Morn, CMA

## 2022-01-09 NOTE — Therapy (Signed)
OUTPATIENT PHYSICAL THERAPY TREATMENT NOTE   Patient Name: Conleigh Heinlein MRN: [id number] DOB:Apr 17, 1991, 31 y.o., female Today's Date: 01/09/2022  PCP: Kerin Perna, NP REFERRING PROVIDER: Marybelle Killings, MD   PT End of Session - 01/09/22 0913     Visit Number 8    Number of Visits 12    Date for PT Re-Evaluation 01/29/22    Authorization Type [employer]    Authorization Time Period 12/12/2021-02/10/2022    Authorization - Visit Number 8    Authorization - Number of Visits 12    PT Start Time 0915    PT Stop Time 0957    PT Time Calculation (min) 42 min    Activity Tolerance Patient tolerated treatment well    Behavior During Therapy WFL for tasks assessed/performed              Past Medical History:  Diagnosis Date   Abdominal pain    Body aches    Chest discomfort    Constipation    Cough    Fatigue    Feeling of sadness    Fever    GERD (gastroesophageal reflux disease)    Helicobacter pylori antibody positive    History of spontaneous abortion    Language barrier, cultural differences    Malaise    Maternal varicella, non-immune    RBBB    Sinusitis chronic, frontal    SOB (shortness of breath)    Vitamin D insufficiency 01/12/2020   History reviewed. No pertinent surgical history. Patient Active Problem List   Diagnosis Date Noted   IUD check up 10/04/2021   Encounter for insertion of mirena IUD 09/12/2021   Language barrier 12/04/2016    REFERRING DIAG: Neck pain [M54.2]  THERAPY DIAG:  Cervicalgia  Muscle weakness  PERTINENT HISTORY: None   SUBJECTIVE:   Pt reports that she continues to have some pain in her neck.  She has been HEP compliant  PAIN:  Are you having pain? Yes NPRS scale: 4/10 Pain location: Neck Pain orientation: Right PAIN TYPE: aching Pain description: intermittent    OBJECTIVE:   *Unless otherwise noted, objective information collected previously* GENERAL OBSERVATION:          Slight rounded shoulders;  forward head                               SENSATION:          Light touch: Appears intact           PALPATION: Diffuse myofacial tenderness bil sub occipitals, cx paraspinals, UT, and bil LS   Cervical ROM   ROM ROM  12/04/2021  Flexion N  Extension N  Right lateral flexion *pulling on L  Left lateral flexion N  Right rotation N  Left rotation N  Flexion rotation (normal is 30 degrees)    Flexion rotation (normal is 30 degrees)      (Blank rows = not tested n=normal)     UPPER EXTREMITY MMT:   MMT Right 12/04/2021 Left 12/04/2021  Shoulder flexion 4 (5/5 on 12/17/2021) 4*forearm (5/5 on 12/17/2021)  Shoulder abduction (C5) 4 (5/5 with mild neck pain on 12/17/2021) 4*forearm (5/5 on 12/17/2021)  Shoulder ER N N  Shoulder IR      Middle trapezius 3+/5, 4/5 (12/17/2021) 3*/5, 4/5 (12/17/2021)  Lower trapezius 3+/5, 4/5 (12/17/2021) 3+/5, 4/5 (12/17/2021)  Shoulder extension  4/5 (12/17/2021)  4/5 (12/17/2021)  Grip strength  Cervical flexion (C1,C2)      Cervical S/B (C3)      Shoulder shrug (C4) n n  Elbow flexion (C6) n n  Elbow ext (C7) n n  Thumb ext (C8) n n  Finger abd (T1) n n    (Blank rows = not tested, score listed is out of 5 possible points.  N = WNL, D = diminished, * = concordant pain with testing)     SPECIAL TESTS:           Spurlings: (-) bil           Hoffman's: (-) bil           ULTT A: (+) L NT R              JOINT MOBILITY TESTING:  Not today   PATIENT SURVEYS:  none              TODAY'S TREATMENT:   [facility] Adult PT Treatment: Therapeutic Exercise:  Elliptical - 5 min for warm up while taking subjective Seated low rows with 55# cable 2x10 Seated lat pull-down with 20# cable 3x15 (hold) Chest press - 20# - 3x10 Shrugs - 3x10 @ 25# Farmers carry - 25# ea hand 180' (hold) KB bottom up - 5# - 3x10 Plank on bosu (black) - 20'' x4 Hip hinge - 25# 3x8 1/2 kneeling chop - 10x2 ea '@17' #  Manual Therapy: Supine suboccipital release Manual chin  tuck with OP     HOME EXERCISE PROGRAM: Access Code: [email or web address] URL: [email or web address] Date: 12/04/2021 Prepared by: Shearon Balo   Exercises Median Nerve Glide - 2 x daily - 7 x weekly - 3 sets - 10 reps Supine Shoulder Horizontal Abduction with Resistance - 1 x daily - 7 x weekly - 3 sets - 10 reps Supine Cervical Retraction with Towel - 2 x daily - 7 x weekly - 3 sets - 10 reps  Added 12/12/2021: Low trap slides at wall with lift-off - 1 x daily - 7 x weekly - 3 sets - 10 reps Standing Shoulder Row with Anchored Resistance - 1 x daily - 7 x weekly - 3 sets - 10 reps - 3-sec hold     ASSESSMENT:   CLINICAL IMPRESSION: Jinna is progressing well with therapy.  Pt reports significant pain reduction following therapy (>/= 2 pts NPRS).  Today we concentrated on core strengthening and cervico-scapulothoracic strengthening.  We moved away from manual today; will gauge response.  Manual seems to be providing temporary relief but has little carry over.  Pt cued for form and pacing throughout.  Pt will continue to benefit from skilled physical therapy to address remaining deficits and achieve listed goals.  Continue per POC.     GOALS:   SHORT TERM GOALS:   STG Name Target Date Goal status  1 Charitie will be >75% HEP compliant to improve carryover between sessions and facilitate independent management of condition   Baseline: No HEP 12/25/2021 MET 1/26    LONG TERM GOALS:    LTG Name Target Date Goal status  1 Zalayah will improve the following MMTs to >/= 4/5 to show improvement in strength:  mid and lower trap    Baseline: Improved to 4/5 on 12/17/2021 01/29/2022 Achieved  2 Tamico will report >/= 50% decrease in pain from evaluation    Baseline: 8/10 max pain at eval, 4/10 current  01/29/2022 Achieved  3 Colbie will be able to lift 45 lbs from floor to waist height,  not limited by pain   Baseline: unable 01/29/2022 INITIAL    PLAN: PT FREQUENCY: 1-2x/week   PT  DURATION: 8 weeks (Ending 01/29/2022)   PLANNED INTERVENTIONS: Therapeutic exercises, Therapeutic activity, Neuro Muscular re-education, Gait training, Patient/Family education, Joint mobilization, Dry Needling, Electrical stimulation, Spinal mobilization and/or manipulation, Moist heat, Taping, Vasopneumatic device, Ionotophoresis 27m/ml Dexamethasone, and Manual therapy   PLAN FOR NEXT SESSION: Progress cervical and periscapular strengthening, neural glides, progressive UE    KKevan NyReinhartsen PT 01/09/22 9:56 AM

## 2022-01-14 ENCOUNTER — Ambulatory Visit: Payer: Medicaid Other | Admitting: Physical Therapy

## 2022-01-16 ENCOUNTER — Encounter: Payer: Self-pay | Admitting: Physical Therapy

## 2022-01-16 ENCOUNTER — Other Ambulatory Visit: Payer: Self-pay

## 2022-01-16 ENCOUNTER — Ambulatory Visit: Payer: Medicaid Other | Admitting: Physical Therapy

## 2022-01-16 DIAGNOSIS — M542 Cervicalgia: Secondary | ICD-10-CM | POA: Diagnosis not present

## 2022-01-16 DIAGNOSIS — M6281 Muscle weakness (generalized): Secondary | ICD-10-CM

## 2022-01-16 NOTE — Therapy (Signed)
OUTPATIENT PHYSICAL THERAPY TREATMENT NOTE   Patient Name: Kaja Jackowski MRN: [id number] DOB:[birth year], 31 y.o., female Today's Date: 01/16/2022  PCP: Kerin Perna, NP REFERRING PROVIDER: Marybelle Killings, MD   PT End of Session - 01/16/22 0913     Visit Number 9    Number of Visits 12    Date for PT Re-Evaluation 01/29/22    Authorization Type [employer]    Authorization Time Period 12/12/2021-02/10/2022    Authorization - Visit Number 9    Authorization - Number of Visits 12    PT Start Time 0915    PT Stop Time 0958    PT Time Calculation (min) 43 min    Activity Tolerance Patient tolerated treatment well    Behavior During Therapy [facility] for tasks assessed/performed              Past Medical History:  Diagnosis Date   Abdominal pain    Body aches    Chest discomfort    Constipation    Cough    Fatigue    Feeling of sadness    Fever    GERD (gastroesophageal reflux disease)    Helicobacter pylori antibody positive    History of spontaneous abortion    Language barrier, cultural differences    Malaise    Maternal varicella, non-immune    RBBB    Sinusitis chronic, frontal    SOB (shortness of breath)    Vitamin D insufficiency 01/12/2020   History reviewed. No pertinent surgical history. Patient Active Problem List   Diagnosis Date Noted   IUD check up 10/04/2021   Encounter for insertion of mirena IUD 09/12/2021   Language barrier 12/04/2016    REFERRING DIAG: Neck pain [M54.2]  THERAPY DIAG:  Cervicalgia  Muscle weakness  PERTINENT HISTORY: None   SUBJECTIVE:   Pt reports that her neck is doing ok.  She was sore after last week.  She feels like she can hold the baby easier than she did before  PAIN:  Are you having pain? Yes NPRS scale: 4/10 Pain location: Neck Pain orientation: Right PAIN TYPE: aching Pain description: intermittent    OBJECTIVE:   *Unless otherwise noted, objective information collected previously* GENERAL  OBSERVATION:          Slight rounded shoulders; forward head                               SENSATION:          Light touch: Appears intact           PALPATION: Diffuse myofacial tenderness bil sub occipitals, cx paraspinals, UT, and bil LS   Cervical ROM   ROM ROM  12/04/2021  Flexion N  Extension N  Right lateral flexion *pulling on L  Left lateral flexion N  Right rotation N  Left rotation N  Flexion rotation (normal is 30 degrees)    Flexion rotation (normal is 30 degrees)      (Blank rows = not tested n=normal)     UPPER EXTREMITY MMT:   MMT Right 12/04/2021 Left 12/04/2021  Shoulder flexion 4 (5/5 on 12/17/2021) 4*forearm (5/5 on 12/17/2021)  Shoulder abduction (C5) 4 (5/5 with mild neck pain on 12/17/2021) 4*forearm (5/5 on 12/17/2021)  Shoulder ER N N  Shoulder IR      Middle trapezius 3+/5, 4/5 (12/17/2021) 3*/5, 4/5 (12/17/2021)  Lower trapezius 3+/5, 4/5 (12/17/2021) 3+/5, 4/5 (12/17/2021)  Shoulder  extension  4/5 (12/17/2021)  4/5 (12/17/2021)  Grip strength      Cervical flexion (C1,C2)      Cervical S/B (C3)      Shoulder shrug (C4) n n  Elbow flexion (C6) n n  Elbow ext (C7) n n  Thumb ext (C8) n n  Finger abd (T1) n n    (Blank rows = not tested, score listed is out of 5 possible points.  N = WNL, D = diminished, * = concordant pain with testing)     SPECIAL TESTS:           Spurlings: (-) bil           Hoffman's: (-) bil           ULTT A: (+) L NT R              JOINT MOBILITY TESTING:  Not today   PATIENT SURVEYS:  none              TODAY'S TREATMENT:   [facility] Adult PT Treatment: Therapeutic Exercise:  Elliptical - 5 min for warm up while taking subjective Seated low rows with 55# cable 2x10 (NT) Seated lat pull-down with 20# cable 3x15 (hold) Chest press - 25# - 2x8 90-90 KB bottom up carry 2x180' 5#, then 10# Shrugs - with rolls each way 2x10 @ 25# KB bottom up OH press - 5# - 3x10 Prone T, Y 3x10 ea Hip hinge - 65# 3x5 Scapular push up -  3x8  Manual Therapy: Grade 5 thoracic manipulation throughout Tx spine     HOME EXERCISE PROGRAM: Access Code: [email or web address] URL: [email or web address] Date: 12/04/2021 Prepared by: Shearon Balo   Exercises Median Nerve Glide - 2 x daily - 7 x weekly - 3 sets - 10 reps Supine Shoulder Horizontal Abduction with Resistance - 1 x daily - 7 x weekly - 3 sets - 10 reps Supine Cervical Retraction with Towel - 2 x daily - 7 x weekly - 3 sets - 10 reps  Added 12/12/2021: Low trap slides at wall with lift-off - 1 x daily - 7 x weekly - 3 sets - 10 reps Standing Shoulder Row with Anchored Resistance - 1 x daily - 7 x weekly - 3 sets - 10 reps - 3-sec hold     ASSESSMENT:   CLINICAL IMPRESSION: Jessy is progressing well with therapy.  Pt reports no increase in baseline pain following therapy.  Today we concentrated on cervico-scapulothoracic strengthening.  Pt progressing strength well.  I spend some time explaining DOMs; pt confirms understanding.  She is going though periods with no pain now, showing significant progress.  Pt will continue to benefit from skilled physical therapy to address remaining deficits and achieve listed goals.  Continue per POC.     GOALS:   SHORT TERM GOALS:   STG Name Target Date Goal status  1 Reida will be >75% HEP compliant to improve carryover between sessions and facilitate independent management of condition   Baseline: No HEP 12/25/2021 MET 1/26    LONG TERM GOALS:    LTG Name Target Date Goal status  1 Grisell will improve the following MMTs to >/= 4/5 to show improvement in strength:  mid and lower trap    Baseline: Improved to 4/5 on 12/17/2021 01/29/2022 Achieved  2 Raylei will report >/= 50% decrease in pain from evaluation    Baseline: 8/10 max pain at eval, 4/10 current  01/29/2022 Achieved  3 Sharne will be  able to lift 45 lbs from floor to waist height, not limited by pain   Baseline: unable 01/29/2022 INITIAL    PLAN: PT FREQUENCY:  1-2x/week   PT DURATION: 8 weeks (Ending 01/29/2022)   PLANNED INTERVENTIONS: Therapeutic exercises, Therapeutic activity, Neuro Muscular re-education, Gait training, Patient/Family education, Joint mobilization, Dry Needling, Electrical stimulation, Spinal mobilization and/or manipulation, Moist heat, Taping, Vasopneumatic device, Ionotophoresis 67m/ml Dexamethasone, and Manual therapy   PLAN FOR NEXT SESSION: Progress cervical and periscapular strengthening, neural glides, progressive UE    KKevan NyReinhartsen PT 01/16/22 10:00 AM

## 2022-01-29 ENCOUNTER — Ambulatory Visit: Payer: Medicaid Other | Admitting: Physical Therapy

## 2022-01-29 DIAGNOSIS — Z419 Encounter for procedure for purposes other than remedying health state, unspecified: Secondary | ICD-10-CM | POA: Diagnosis not present

## 2022-01-30 ENCOUNTER — Encounter: Payer: Self-pay | Admitting: Physical Therapy

## 2022-02-04 ENCOUNTER — Ambulatory Visit: Payer: Medicaid Other | Admitting: Physical Therapy

## 2022-02-06 ENCOUNTER — Ambulatory Visit: Payer: Medicaid Other | Admitting: Physical Therapy

## 2022-02-18 ENCOUNTER — Ambulatory Visit: Payer: Medicaid Other | Admitting: Critical Care Medicine

## 2022-03-01 DIAGNOSIS — Z419 Encounter for procedure for purposes other than remedying health state, unspecified: Secondary | ICD-10-CM | POA: Diagnosis not present

## 2022-03-31 DIAGNOSIS — Z419 Encounter for procedure for purposes other than remedying health state, unspecified: Secondary | ICD-10-CM | POA: Diagnosis not present

## 2022-05-01 DIAGNOSIS — Z419 Encounter for procedure for purposes other than remedying health state, unspecified: Secondary | ICD-10-CM | POA: Diagnosis not present

## 2022-05-30 ENCOUNTER — Ambulatory Visit (INDEPENDENT_AMBULATORY_CARE_PROVIDER_SITE_OTHER): Payer: Medicaid Other | Admitting: Primary Care

## 2022-05-31 DIAGNOSIS — Z419 Encounter for procedure for purposes other than remedying health state, unspecified: Secondary | ICD-10-CM | POA: Diagnosis not present

## 2022-06-26 ENCOUNTER — Encounter (INDEPENDENT_AMBULATORY_CARE_PROVIDER_SITE_OTHER): Payer: Self-pay | Admitting: Primary Care

## 2022-06-26 ENCOUNTER — Ambulatory Visit (INDEPENDENT_AMBULATORY_CARE_PROVIDER_SITE_OTHER): Payer: Medicaid Other | Admitting: Primary Care

## 2022-06-26 VITALS — BP 109/69 | HR 87 | Temp 98.2°F | Ht 64.0 in | Wt 113.4 lb

## 2022-06-26 DIAGNOSIS — K2100 Gastro-esophageal reflux disease with esophagitis, without bleeding: Secondary | ICD-10-CM | POA: Diagnosis not present

## 2022-06-26 DIAGNOSIS — F411 Generalized anxiety disorder: Secondary | ICD-10-CM

## 2022-06-26 DIAGNOSIS — E559 Vitamin D deficiency, unspecified: Secondary | ICD-10-CM | POA: Diagnosis not present

## 2022-06-26 DIAGNOSIS — R5383 Other fatigue: Secondary | ICD-10-CM

## 2022-06-26 NOTE — Progress Notes (Signed)
[facility]  Brianna Haley, is a 31 y.o. female  NTI:[id number]  QQP:[id number]  DOB - [birth year]  Chief Complaint  Patient presents with   Follow-up    Vitamin D levels        Subjective:   Brianna Haley is a 31 y.o. [name] female(interpreter [place] [phone])  here today for a follow up visit for vitamin D deficiency. Patient has No headache, No chest pain,  No new weakness tingling or numbness, No Cough - shortness of breath. She voices random sharpe pains in her abdomen with N/V and diarrhea. She states she is not pregnant she just stopped her menstrual cycle yesterday and has a IUD in place. Not to identify any factors no change in diet or restaurant or children who are sick.  No problems updated.  No Known Allergies  Past Medical History:  Diagnosis Date   Abdominal pain    Body aches    Chest discomfort    Constipation    Cough    Fatigue    Feeling of sadness    Fever    GERD (gastroesophageal reflux disease)    Helicobacter pylori antibody positive    History of spontaneous abortion    Language barrier, cultural differences    Malaise    Maternal varicella, non-immune    RBBB    Sinusitis chronic, frontal    SOB (shortness of breath)    Vitamin D insufficiency 01/12/2020    Current Outpatient Medications on File Prior to Visit  Medication Sig Dispense Refill   Cholecalciferol (VITAMIN D3) 50 MCG (2000 UT) capsule Take 1 capsule (2,000 Units total) by mouth daily. 90 capsule 1   ergocalciferol (VITAMIN D2) 1.25 MG (50000 UT) capsule Take 1 capsule (50,000 Units total) by mouth once a week. 12 capsule 0   omeprazole (PRILOSEC) 20 MG capsule Take 1 capsule (20 mg total) by mouth daily. 30 capsule 3   No current facility-administered medications on file prior to visit.    Objective:   Vitals:   06/26/22 1343  BP: 109/69  Pulse: 87  Temp: 98.2 F (36.8 C)  TempSrc: Oral  Weight: 113 lb 6.4 oz (51.4 kg)  Height: 5\' 4"  (1.626 m)     Exam General appearance : Awake, alert, not in any distress. Speech Clear. Not toxic looking HEENT: Atraumatic and Normocephalic, pupils equally reactive to light and accomodation Neck: Supple, no JVD. No cervical lymphadenopathy.  Chest: Good air entry bilaterally, no added sounds  CVS: S1 S2 regular, no murmurs.  Abdomen: Bowel sounds present, Non tender and not distended with no gaurding, rigidity or rebound. Extremities: B/L Lower Ext shows no edema, both legs are warm to touch Neurology: Awake alert, and oriented X 3,  Non focal Skin: No Rash  Data Review No results found for: "HGBA1C"  Assessment & Plan  Taline was seen today for follow-up.  Diagnoses and all orders for this visit:  Vitamin D deficiency -     Vitamin D, 25-hydroxy  Fatigue, unspecified type -     CBC with Differential   Gastroesophageal reflux disease with esophagitis without hemorrhage Discussed eating small frequent meal, reduction in acidic foods, fried foods ,spicy foods, alcohol caffeine and tobacco and certain medications. Avoid laying down after eating 49mins-1hour, elevated head of the bed.   Generalized anxiety disorder Referred to CSW seen today. Follow up will discuss medication options.       Patient have been counseled extensively about nutrition and exercise. Other issues discussed during  this visit include: low cholesterol diet, weight control and daily exercise, foot care, annual eye examinations at Ophthalmology, importance of adherence with medications and regular follow-up. We also discussed long term complications of uncontrolled diabetes and hypertension.     The patient was given clear instructions to go to ER or return to medical center if symptoms don't improve, worsen or new problems develop. The patient verbalized understanding. The patient was told to call to get lab results if they haven't heard anything in the next week.   This note has been created with Engineer, agricultural. Any transcriptional errors are unintentional.   Grayce Sessions, NP 06/26/2022, 1:50 PM

## 2022-06-26 NOTE — Progress Notes (Signed)
Pt complains of intermittent abdominal pain for the last pain causing loss of appetite and sometimes diarrhea

## 2022-06-27 LAB — CBC WITH DIFFERENTIAL/PLATELET
Basophils Absolute: 0.1 10*3/uL (ref 0.0–0.2)
Basos: 1 %
EOS (ABSOLUTE): 0.3 10*3/uL (ref 0.0–0.4)
Eos: 3 %
Hematocrit: 38.5 % (ref 34.0–46.6)
Hemoglobin: 12.6 g/dL (ref 11.1–15.9)
Immature Grans (Abs): 0 10*3/uL (ref 0.0–0.1)
Immature Granulocytes: 0 %
Lymphocytes Absolute: 1.8 10*3/uL (ref 0.7–3.1)
Lymphs: 19 %
MCH: 30.9 pg (ref 26.6–33.0)
MCHC: 32.7 g/dL (ref 31.5–35.7)
MCV: 94 fL (ref 79–97)
Monocytes Absolute: 0.7 10*3/uL (ref 0.1–0.9)
Monocytes: 7 %
Neutrophils Absolute: 6.6 10*3/uL (ref 1.4–7.0)
Neutrophils: 70 %
Platelets: 245 10*3/uL (ref 150–450)
RBC: 4.08 x10E6/uL (ref 3.77–5.28)
RDW: 11.4 % — ABNORMAL LOW (ref 11.7–15.4)
WBC: 9.5 10*3/uL (ref 3.4–10.8)

## 2022-06-27 LAB — CMP14+EGFR
ALT: 11 IU/L (ref 0–32)
AST: 13 IU/L (ref 0–40)
Albumin/Globulin Ratio: 1.6 (ref 1.2–2.2)
Albumin: 4.6 g/dL (ref 4.0–5.0)
Alkaline Phosphatase: 59 IU/L (ref 44–121)
BUN/Creatinine Ratio: 27 — ABNORMAL HIGH (ref 9–23)
BUN: 15 mg/dL (ref 6–20)
Bilirubin Total: 0.5 mg/dL (ref 0.0–1.2)
CO2: 25 mmol/L (ref 20–29)
Calcium: 9.3 mg/dL (ref 8.7–10.2)
Chloride: 104 mmol/L (ref 96–106)
Creatinine, Ser: 0.55 mg/dL — ABNORMAL LOW (ref 0.57–1.00)
Globulin, Total: 2.9 g/dL (ref 1.5–4.5)
Glucose: 81 mg/dL (ref 70–99)
Potassium: 3.8 mmol/L (ref 3.5–5.2)
Sodium: 141 mmol/L (ref 134–144)
Total Protein: 7.5 g/dL (ref 6.0–8.5)
eGFR: 126 mL/min/{1.73_m2} (ref >59)

## 2022-06-27 LAB — VITAMIN D 25 HYDROXY (VIT D DEFICIENCY, FRACTURES): Vit D, 25-Hydroxy: 26.9 ng/mL — ABNORMAL LOW (ref 30.0–100.0)

## 2022-06-30 ENCOUNTER — Telehealth (INDEPENDENT_AMBULATORY_CARE_PROVIDER_SITE_OTHER): Payer: Self-pay | Admitting: Primary Care

## 2022-06-30 ENCOUNTER — Other Ambulatory Visit (INDEPENDENT_AMBULATORY_CARE_PROVIDER_SITE_OTHER): Payer: Self-pay | Admitting: Primary Care

## 2022-06-30 DIAGNOSIS — K2100 Gastro-esophageal reflux disease with esophagitis, without bleeding: Secondary | ICD-10-CM

## 2022-06-30 DIAGNOSIS — R1013 Epigastric pain: Secondary | ICD-10-CM

## 2022-06-30 MED ORDER — OMEPRAZOLE 20 MG PO CPDR: 20.0000 mg | DELAYED_RELEASE_CAPSULE | Freq: Every day | ORAL | 3 refills | Status: DC

## 2022-06-30 NOTE — Telephone Encounter (Signed)
Please advise 

## 2022-06-30 NOTE — Telephone Encounter (Signed)
Copied from CRM [id number]. Topic: General - Other >> Jun 30, 2022 12:30 PM Brianna Haley wrote: Reason for CRM: The patient was seen on 06/26/22 and shares that they were told that they would be provided a new prescription for a medication to help with their stomach discomfort  The patient has been in contact with their pharmacy and has been told that no new prescription has been submitted for them   Please contact the patient to discuss further when possible

## 2022-07-01 ENCOUNTER — Telehealth: Payer: Self-pay | Admitting: Licensed Clinical Social Worker

## 2022-07-01 NOTE — Telephone Encounter (Signed)
Pt met with LCSWA briefly after her PCP visit. Pt complained about abdominal pain and weight changes. Pt states she has a desire weight that is very heathly and she would like to reach her goal and maintain. Pt reports she is currently underweight right now due to several foods causing her pain and making her vomit. Pt dicussed how she is often very anxious and over thinking. LCSWA provided pt with coping skills for her anxiety such as groundling, walks outside and yoga. LCSWA scheduled an initial visit to discuss more on 07/24/22

## 2022-07-02 ENCOUNTER — Ambulatory Visit (INDEPENDENT_AMBULATORY_CARE_PROVIDER_SITE_OTHER): Payer: Medicaid Other | Admitting: Primary Care

## 2022-07-02 NOTE — Telephone Encounter (Signed)
Called patient (interpreter [facility] [phone] ) sent in omeprazole and reviewed labs results and recc. Vit. D3 2000iu daily

## 2022-07-24 ENCOUNTER — Institutional Professional Consult (permissible substitution) (INDEPENDENT_AMBULATORY_CARE_PROVIDER_SITE_OTHER): Payer: Medicaid Other | Admitting: Licensed Clinical Social Worker

## 2022-07-24 ENCOUNTER — Encounter (INDEPENDENT_AMBULATORY_CARE_PROVIDER_SITE_OTHER): Payer: Medicaid Other | Admitting: Licensed Clinical Social Worker

## 2022-07-24 ENCOUNTER — Ambulatory Visit (INDEPENDENT_AMBULATORY_CARE_PROVIDER_SITE_OTHER): Payer: Medicaid Other | Admitting: Primary Care

## 2022-10-14 ENCOUNTER — Ambulatory Visit: Payer: Medicaid Other | Admitting: Obstetrics and Gynecology

## 2022-10-14 ENCOUNTER — Encounter: Payer: Self-pay | Admitting: Obstetrics and Gynecology

## 2022-10-15 NOTE — Progress Notes (Signed)
Pt not seen.

## 2023-02-24 ENCOUNTER — Other Ambulatory Visit (HOSPITAL_COMMUNITY)
Admission: RE | Admit: 2023-02-24 | Discharge: 2023-02-24 | Disposition: A | Discharge status: Home / Self Care | Payer: Self-pay | Source: Ambulatory Visit | Attending: Family Medicine | Admitting: Family Medicine

## 2023-02-24 ENCOUNTER — Ambulatory Visit (INDEPENDENT_AMBULATORY_CARE_PROVIDER_SITE_OTHER): Payer: Self-pay | Admitting: Family Medicine

## 2023-02-24 ENCOUNTER — Encounter: Payer: Self-pay | Admitting: Family Medicine

## 2023-02-24 VITALS — BP 116/65 | HR 81 | Ht 64.0 in | Wt 115.2 lb

## 2023-02-24 DIAGNOSIS — N898 Other specified noninflammatory disorders of vagina: Secondary | ICD-10-CM

## 2023-02-24 DIAGNOSIS — Z789 Other specified health status: Secondary | ICD-10-CM

## 2023-02-24 NOTE — Progress Notes (Signed)
Pt presents for vaginal itching for 1 week. Pt also reports yellow vaginal discharge. No other concerns.

## 2023-02-24 NOTE — Progress Notes (Signed)
   GYNECOLOGY OFFICE VISIT NOTE  History:   Brianna Haley is a 32 y.o. [id number] here today for vaginal irritation and discharge. She has had no irregular bleeding. Menstrual periods are regular, and she started her menstrual period today. No history of STI exposure.    History reviewed. No pertinent surgical history.  The following portions of the patient's history were reviewed and updated as appropriate: allergies, current medications, past family history, past medical history, past social history, past surgical history and problem list.   Health Maintenance:  Pap due in 06/2023. Will like to reschedule for that. No MG due yet, given age.  Review of Systems:  Pertinent items noted in HPI and remainder of comprehensive ROS otherwise negative.  Physical Exam:  BP 116/65   Pulse 81   Ht 5\' 4"  (1.626 m)   Wt 115 lb 3.2 oz (52.3 kg)   LMP 02/24/2023   BMI 19.77 kg/m   CONSTITUTIONAL: Well-developed, well-nourished female in no acute distress.  HEENT:  Normocephalic, atraumatic. External right and left ear normal. No scleral icterus.  NECK: Normal range of motion, supple, no masses noted on observation SKIN: No rash noted. Not diaphoretic. No erythema. No pallor. MUSCULOSKELETAL: Normal range of motion. No edema noted. NEUROLOGIC: Alert and oriented to person, place, and time. Normal muscle tone coordination. No cranial nerve deficit noted. PSYCHIATRIC: Normal mood and affect. Normal behavior. Normal judgment and thought content. CARDIOVASCULAR: Normal heart rate noted RESPIRATORY: Effort and breath sounds normal, no problems with respiration noted ABDOMEN: No masses noted. No other overt distention noted.   PELVIC:  normal appearing external genitalia, minimal vaginal discharge noted.  Labs and Imaging No results found for this or any previous visit (from the past 168 hour(s)). No results found.    Assessment and Plan:  \ Vaginal discharge - Plan: Cervicovaginal ancillary only(  [place]) Likely vaginitis.  Vaginal swabs collected.  Will treat based on results. Declines STI screening.  Routine preventative health maintenance measures emphasized. Please refer to After Visit Summary for other counseling recommendations.   Follow up PRN.  I spent 15 minutes dedicated to the care of this patient including pre-visit review of records, face to face time with the patient discussing her conditions and treatments and post visit orders.  Liliane Channel MD MPH OB Fellow, [place] [facility] [place] 02/24/2023

## 2023-02-26 LAB — CERVICOVAGINAL ANCILLARY ONLY
Bacterial Vaginitis (gardnerella): NEGATIVE
Candida Glabrata: NEGATIVE
Candida Vaginitis: POSITIVE — AB
Comment: NEGATIVE
Comment: NEGATIVE
Comment: NEGATIVE
Comment: NEGATIVE
Trichomonas: NEGATIVE

## 2023-02-27 ENCOUNTER — Other Ambulatory Visit: Payer: Self-pay | Admitting: Family Medicine

## 2023-02-27 DIAGNOSIS — B3731 Acute candidiasis of vulva and vagina: Secondary | ICD-10-CM

## 2023-02-27 MED ORDER — FLUCONAZOLE 150 MG PO TABS: 150.0000 mg | ORAL_TABLET | Freq: Once | ORAL | 0 refills | Status: AC

## 2023-12-25 ENCOUNTER — Encounter (HOSPITAL_COMMUNITY): Payer: Self-pay | Admitting: Emergency Medicine

## 2023-12-25 ENCOUNTER — Ambulatory Visit (HOSPITAL_COMMUNITY)
Admission: EM | Admit: 2023-12-25 | Discharge: 2023-12-25 | Disposition: A | Discharge status: Home / Self Care | Payer: Self-pay | Attending: Internal Medicine | Admitting: Internal Medicine

## 2023-12-25 DIAGNOSIS — K625 Hemorrhage of anus and rectum: Secondary | ICD-10-CM

## 2023-12-25 DIAGNOSIS — K645 Perianal venous thrombosis: Secondary | ICD-10-CM

## 2023-12-25 MED ORDER — HYDROCORTISONE 1 % EX CREA: TOPICAL_CREAM | CUTANEOUS | 0 refills | Status: AC

## 2023-12-25 MED ORDER — DOCUSATE SODIUM 100 MG PO CAPS
100.0000 mg | ORAL_CAPSULE | Freq: Every day | ORAL | 0 refills | Status: AC
Start: 2023-12-25 — End: ?

## 2023-12-25 NOTE — Discharge Instructions (Addendum)
Apply preparation H hemorrhoid cream every 12 hours for the next 7 days. Do not use for greater than 7 days.  Take stool softener daily to keep stools soft like peanut butter.  If you become further constipated, take miralax every 12 hours as needed. Drink plenty of water and eat lots of fiber. Avoid sitting on the toilet for long periods of time. If you have not had a bowel movement in 5 minutes after sitting on the toilet, get up and try again later.  If the bleeding has not stopped in 2-3 days or if you become dizzy, lightheaded, or develop new or worsening symptoms please return to urgent care. Schedule an appointment with the general surgeon listed on your paperwork if the hemorrhoid does not go away as it may need to be surgically removed.

## 2023-12-25 NOTE — ED Triage Notes (Signed)
Used interpretor  Pt having bleeding and pain when has BM for 4 days. Pt reports stool is soft. Reports even when urinates has bleeding out of rectum.

## 2023-12-25 NOTE — ED Provider Notes (Signed)
[facility]    CSN: [id number] Arrival date & time: 12/25/23  1646      History   Chief Complaint Chief Complaint  Patient presents with   Rectal Bleeding    HPI Brianna Haley is a 33 y.o. female.   Brianna Haley is a 33 y.o. female presenting for chief complaint of bump to the perirectal area that she first noticed 4 days ago and bleeding from the rectal area that started yesterday after she had a bowel movement. Blood is bright red in color and she states she has seen 1-2 small clots from the rectum in the last few hours. She is confident bleeding is coming from the rectal area and not vaginal. Bulging area in the rectum is itchy and with burning sensation. States the bulging area has decreased in size since bleeding started. Stools are soft, though sometimes she has to strain to have a BM. Last BM was yesterday. Denies fevers, chills, nausea, vomiting, abdominal pain, lightheadedness, dizziness, and syncope. No personal or family history of colorectal problems. She has never had a hemorrhoid before.   The history is provided by the patient. The history is limited by a language barrier. A language interpreter was used (Video language interpreter used for entirety of patient encounter).  Rectal Bleeding   Past Medical History:  Diagnosis Date   Abdominal pain    Body aches    Chest discomfort    Constipation    Cough    Fatigue    Feeling of sadness    Fever    GERD (gastroesophageal reflux disease)    Helicobacter pylori antibody positive    History of spontaneous abortion    Language barrier, cultural differences    Malaise    Maternal varicella, non-immune    RBBB    Sinusitis chronic, frontal    SOB (shortness of breath)    Vitamin D insufficiency 01/12/2020    Patient Active Problem List   Diagnosis Date Noted   IUD check up 10/04/2021   Encounter for insertion of Mirena IUD 09/12/2021   Language barrier 12/04/2016    No past surgical history on  file.  OB History     Gravida  3   Para  2   Term  2   Preterm  0   AB  1   Living  2      SAB  1   IAB  0   Ectopic  0   Multiple  0   Live Births  2            Home Medications    Prior to Admission medications   Medication Sig Start Date End Date Taking? Authorizing Provider  docusate sodium (COLACE) 100 MG capsule Take 1 capsule (100 mg total) by mouth daily. 12/25/23  Yes Carlisle Beers, FNP  hydrocortisone cream 1 % Apply to affected area 2 times daily 12/25/23  Yes Carlisle Beers, FNP  Cholecalciferol (VITAMIN D3) 50 MCG (2000 UT) capsule Take 1 capsule (2,000 Units total) by mouth daily. 01/03/22   Grayce Sessions, NP  ergocalciferol (VITAMIN D2) 1.25 MG (50000 UT) capsule Take 1 capsule (50,000 Units total) by mouth once a week. 01/03/22   Grayce Sessions, NP  omeprazole (PRILOSEC) 20 MG capsule Take 1 capsule (20 mg total) by mouth daily. Patient not taking: Reported on 10/14/2022 06/30/22   Grayce Sessions, NP    Family History Family History  Problem Relation Age of Onset  Healthy Mother    Healthy Father     Social History Social History   Tobacco Use   Smoking status: Never   Smokeless tobacco: Never  Vaping Use   Vaping status: Never Used  Substance Use Topics   Alcohol use: No   Drug use: No     Allergies   Patient has no known allergies.   Review of Systems Review of Systems  Gastrointestinal:  Positive for hematochezia.  Per HPI   Physical Exam Triage Vital Signs ED Triage Vitals  Encounter Vitals Group     BP 12/25/23 1729 115/75     Systolic BP Percentile --      Diastolic BP Percentile --      Pulse Rate 12/25/23 1729 83     Resp 12/25/23 1729 14     Temp 12/25/23 1729 98.4 F (36.9 C)     Temp Source 12/25/23 1729 Oral     SpO2 12/25/23 1729 98 %     Weight --      Height --      Head Circumference --      Peak Flow --      Pain Score 12/25/23 1726 10     Pain Loc --      Pain  Education --      Exclude from Growth Chart --    No data found.  Updated Vital Signs BP 115/75 (BP Location: Left Arm)   Pulse 83   Temp 98.4 F (36.9 C) (Oral)   Resp 14   LMP 11/30/2023   SpO2 98%   Visual Acuity Right Eye Distance:   Left Eye Distance:   Bilateral Distance:    Right Eye Near:   Left Eye Near:    Bilateral Near:     Physical Exam Vitals and nursing note reviewed. Exam conducted with a chaperone present Lyla Son, RN present for GU exam).  Constitutional:      Appearance: She is not ill-appearing or toxic-appearing.  HENT:     Head: Normocephalic and atraumatic.     Right Ear: Hearing and external ear normal.     Left Ear: Hearing and external ear normal.     Nose: Nose normal.     Mouth/Throat:     Lips: Pink.  Eyes:     General: Lids are normal. Vision grossly intact. Gaze aligned appropriately.     Extraocular Movements: Extraocular movements intact.     Conjunctiva/sclera: Conjunctivae normal.  Pulmonary:     Effort: Pulmonary effort is normal.  Genitourinary:    Exam position: Knee-chest position.    Musculoskeletal:     Cervical back: Neck supple.  Skin:    General: Skin is warm and dry.     Capillary Refill: Capillary refill takes less than 2 seconds.     Findings: No rash.  Neurological:     General: No focal deficit present.     Mental Status: She is alert and oriented to person, place, and time. Mental status is at baseline.     Cranial Nerves: No dysarthria or facial asymmetry.  Psychiatric:        Mood and Affect: Mood normal.        Speech: Speech normal.        Behavior: Behavior normal.        Thought Content: Thought content normal.        Judgment: Judgment normal.      UC Treatments / Results  Labs (all labs ordered are  listed, but only abnormal results are displayed) Labs Reviewed - No data to display  EKG   Radiology No results found.  Procedures Procedures (including critical care time)  Medications  Ordered in UC Medications - No data to display  Initial Impression / Assessment and Plan / UC Course  I have reviewed the triage vital signs and the nursing notes.  Pertinent labs & imaging results that were available during my care of the patient were reviewed by me and considered in my medical decision making (see chart for details).   1. Thrombosed external hemorrhoid, BRBPR Thrombosed external hemorrhoid on exam. Preparation H hemorrhoid cream prescribed to be used twice daily for the next 7 to 10 days. Stools to be Soft with MiraLAX and Colace stool softener as needed. Suspect bleeding will subside in the next 1 to 2 days, recommend follow-up with general surgery if symptoms fail to improve in the next 2 to 3 days to discuss option to have this surgically excised given size and severity of hemorrhoid. Behavioral and lifestyle changes discussed including increased water/fiber intake. Patient is agreeable with plan.  Counseled patient on potential for adverse effects with medications prescribed/recommended today, strict ER and return-to-clinic precautions discussed, patient verbalized understanding.    Final Clinical Impressions(s) / UC Diagnoses   Final diagnoses:  Thrombosed external hemorrhoid  Bright red blood per rectum     Discharge Instructions      Apply preparation H hemorrhoid cream every 12 hours for the next 7 days. Do not use for greater than 7 days.  Take stool softener daily to keep stools soft like peanut butter.  If you become further constipated, take miralax every 12 hours as needed. Drink plenty of water and eat lots of fiber. Avoid sitting on the toilet for long periods of time. If you have not had a bowel movement in 5 minutes after sitting on the toilet, get up and try again later.  If the bleeding has not stopped in 2-3 days or if you become dizzy, lightheaded, or develop new or worsening symptoms please return to urgent care. Schedule an appointment  with the general surgeon listed on your paperwork if the hemorrhoid does not go away as it may need to be surgically removed.     ED Prescriptions     Medication Sig Dispense Auth. Provider   hydrocortisone cream 1 % Apply to affected area 2 times daily 15 g Reita May M, FNP   docusate sodium (COLACE) 100 MG capsule Take 1 capsule (100 mg total) by mouth daily. 30 capsule Carlisle Beers, FNP      PDMP not reviewed this encounter.   Carlisle Beers, [place] 12/25/23 1901

## 2024-03-30 ENCOUNTER — Ambulatory Visit (HOSPITAL_COMMUNITY)
Admission: EM | Admit: 2024-03-30 | Discharge: 2024-03-30 | Disposition: A | Discharge status: Home / Self Care | Payer: Self-pay | Attending: Nurse Practitioner | Admitting: Nurse Practitioner

## 2024-03-30 ENCOUNTER — Encounter (HOSPITAL_COMMUNITY): Payer: Self-pay

## 2024-03-30 DIAGNOSIS — R350 Frequency of micturition: Secondary | ICD-10-CM

## 2024-03-30 LAB — POCT URINALYSIS DIP (MANUAL ENTRY)
Bilirubin, UA: NEGATIVE
Glucose, UA: NEGATIVE mg/dL
Ketones, POC UA: NEGATIVE mg/dL
Leukocytes, UA: NEGATIVE
Nitrite, UA: NEGATIVE
Protein Ur, POC: NEGATIVE mg/dL
Spec Grav, UA: 1.015 (ref 1.010–1.025)
Urobilinogen, UA: 0.2 U/dL
pH, UA: 7 (ref 5.0–8.0)

## 2024-03-30 LAB — POCT URINE PREGNANCY: Preg Test, Ur: NEGATIVE

## 2024-03-30 MED ORDER — IBUPROFEN 600 MG PO TABS: 600.0000 mg | ORAL_TABLET | Freq: Four times a day (QID) | ORAL | 0 refills | Status: DC | PRN

## 2024-03-30 MED ORDER — ONDANSETRON 8 MG PO TBDP: 8.0000 mg | ORAL_TABLET | Freq: Three times a day (TID) | ORAL | 0 refills | Status: AC | PRN

## 2024-03-30 NOTE — ED Triage Notes (Signed)
 Patient reports lower back pain and dysuria x 2 days.

## 2024-03-30 NOTE — Discharge Instructions (Addendum)
 You were seen today for urinary frequency. Your urinalysis did not show any signs of infection or other abnormalities, and a urine culture has been sent to check for any hidden causes. You will receive further instructions based on those results. In the meantime, try to avoid caffeine and reduce your overall fluid intake, especially in the evening, to help improve your symptoms. Avoid foods and drinks that can irritate the bladder such as coffee, tea, soda, alcohol, artificial sweeteners, citrus fruits, tomato-based products, and chocolate. Take any medications prescribed to help manage your symptoms as directed. Follow up with your primary care provider if your symptoms do not improve or if they begin to get worse.  ??????????????????????????????????????????????????????????????? ????????????????????????????????????????????????????????????????????????????? ????????????????????????????????????????????????????????????????????????? ???????????????????????????????????????????????????????????? ????????? ????????????????????????????????????? ???????????????????????????????????????????????? ???????????????????? ??????????????????????????????????????? ??????????????????????????????????????????????? ??????????? ?? ???? ???? ???????????????? ?????????????? ?????????????????????????????? ?????????? ????????????????????????????????????????????????????? ??????????????????????????? ?????????????????????????????????????????? ???????????????????????????? ????????????????

## 2024-03-30 NOTE — ED Provider Notes (Signed)
 [facility]    CSN: [id number] Arrival date & time: 03/30/24  1512      History   Chief Complaint Chief Complaint  Patient presents with   Dysuria   Back Pain    HPI Adrienne Gisi is a 33 y.o. female.   Patient's primary language is Statistician. Interpreter used for the encounter. Orelia Binet #[id number]  Subjective:  Sonakshi Chambers is a 33 year old female presenting with complaints of fatigue, urinary frequency, foul-smelling urine, right lower back pain, and intermittent nausea over the past two weeks. She denies dysuria, vaginal discharge, genital sores, or vomiting. She has not noticed increased thirst. The patient does not have a history of recurrent urinary tract infections. Her last menstrual period was last week. She uses an intrauterine device (IUD) for birth control and is sexually active with her husband without condom use. She denies any concerns for sexually transmitted infections.  The following portions of the patient's history were reviewed and updated as appropriate: allergies, current medications, past family history, past medical history, past social history, past surgical history, and problem list.       Past Medical History:  Diagnosis Date   Abdominal pain    Body aches    Chest discomfort    Constipation    Cough    Fatigue    Feeling of sadness    Fever    GERD (gastroesophageal reflux disease)    Helicobacter pylori antibody positive    History of spontaneous abortion    Language barrier, cultural differences    Malaise    Maternal varicella, non-immune    RBBB    Sinusitis chronic, frontal    SOB (shortness of breath)    Vitamin D  insufficiency 01/12/2020    Patient Active Problem List   Diagnosis Date Noted   IUD check up 10/04/2021   Encounter for insertion of Mirena  IUD 09/12/2021   Language barrier 12/04/2016    History reviewed. No pertinent surgical history.  OB History     Gravida  3   Para  2   Term  2   Preterm  0    AB  1   Living  2      SAB  1   IAB  0   Ectopic  0   Multiple  0   Live Births  2            Home Medications    Prior to Admission medications   Medication Sig Start Date End Date Taking? Authorizing Provider  Cholecalciferol (VITAMIN D3) 50 MCG (2000 UT) capsule Take 1 capsule (2,000 Units total) by mouth daily. 01/03/22  Yes Marius Siemens, NP  docusate sodium  (COLACE) 100 MG capsule Take 1 capsule (100 mg total) by mouth daily. 12/25/23  Yes Starlene Eaton, FNP  ergocalciferol  (VITAMIN D2) 1.25 MG (50000 UT) capsule Take 1 capsule (50,000 Units total) by mouth once a week. 01/03/22  Yes Marius Siemens, NP  hydrocortisone  cream 1 % Apply to affected area 2 times daily 12/25/23  Yes Stanhope, Danny Dye, FNP  ibuprofen  (ADVIL ) 600 MG tablet Take 1 tablet (600 mg total) by mouth every 6 (six) hours as needed (back pain). 03/30/24  Yes Maryruth Sol, FNP  omeprazole  (PRILOSEC) 20 MG capsule Take 1 capsule (20 mg total) by mouth daily. 06/30/22  Yes Marius Siemens, NP  ondansetron  (ZOFRAN -ODT) 8 MG disintegrating tablet Take 1 tablet (8 mg total) by mouth every 8 (eight) hours as needed for nausea  or vomiting. 03/30/24  Yes Maryruth Sol, FNP    Family History Family History  Problem Relation Age of Onset   Healthy Mother    Healthy Father     Social History Social History   Tobacco Use   Smoking status: Never   Smokeless tobacco: Never  Vaping Use   Vaping status: Never Used  Substance Use Topics   Alcohol use: No   Drug use: No     Allergies   Patient has no known allergies.   Review of Systems Review of Systems  Constitutional:  Positive for fatigue. Negative for fever.  Gastrointestinal:  Negative for abdominal pain, nausea and vomiting.  Genitourinary:  Positive for frequency. Negative for dysuria, enuresis, flank pain, genital sores, menstrual problem, urgency and vaginal discharge.  Musculoskeletal:  Positive for back pain.   All other systems reviewed and are negative.    Physical Exam Triage Vital Signs ED Triage Vitals [03/30/24 1649]  Encounter Vitals Group     BP (!) 94/57     Systolic BP Percentile      Diastolic BP Percentile      Pulse Rate 87     Resp 18     Temp 98.5 F (36.9 C)     Temp Source Oral     SpO2 98 %     Weight      Height      Head Circumference      Peak Flow      Pain Score      Pain Loc      Pain Education      Exclude from Growth Chart    No data found.  Updated Vital Signs BP 103/68   Pulse 87   Temp 98.5 F (36.9 C) (Oral)   Resp 18   LMP 03/15/2024 (Approximate)   SpO2 98%   Visual Acuity Right Eye Distance:   Left Eye Distance:   Bilateral Distance:    Right Eye Near:   Left Eye Near:    Bilateral Near:     Physical Exam Vitals reviewed.  Constitutional:      General: She is not in acute distress.    Appearance: Normal appearance. She is not toxic-appearing.  HENT:     Head: Normocephalic.     Mouth/Throat:     Mouth: Mucous membranes are moist.  Eyes:     Conjunctiva/sclera: Conjunctivae normal.  Cardiovascular:     Rate and Rhythm: Normal rate and regular rhythm.     Heart sounds: Normal heart sounds.  Pulmonary:     Effort: Pulmonary effort is normal.     Breath sounds: Normal breath sounds.  Abdominal:     General: Bowel sounds are normal. There is no distension.     Palpations: Abdomen is soft.     Tenderness: There is no abdominal tenderness. There is no right CVA tenderness or left CVA tenderness.  Genitourinary:    Comments: Deferred  Musculoskeletal:        General: Normal range of motion.  Skin:    General: Skin is warm and dry.  Neurological:     General: No focal deficit present.     Mental Status: She is alert and oriented to person, place, and time.      UC Treatments / Results  Labs (all labs ordered are listed, but only abnormal results are displayed) Labs Reviewed  POCT URINALYSIS DIP (MANUAL ENTRY) -  Abnormal; Notable for the following components:  Result Value   Blood, UA trace-intact (*)    All other components within normal limits  POCT URINE PREGNANCY    EKG   Radiology No results found.  Procedures Procedures (including critical care time)  Medications Ordered in UC Medications - No data to display  Initial Impression / Assessment and Plan / UC Course  I have reviewed the triage vital signs and the nursing notes.  Pertinent labs & imaging results that were available during my care of the patient were reviewed by me and considered in my medical decision making (see chart for details).    33 year old female presenting with a two-week history of fatigue, urinary frequency, foul-smelling urine, right lower back pain, and intermittent nausea. She denies dysuria, vaginal discharge, genital sores, or vomiting. She is afebrile and nontoxic. Physical exam is as documented. Urinalysis is unremarkable with no signs of infection or other abnormalities, and urine pregnancy test is negative. A urine culture has been sent and is pending. The patient was advised to reduce overall fluid intake, especially in the evenings, and to avoid foods and beverages that may irritate the bladder. She was instructed to monitor her symptoms closely. Ibuprofen  and Zofran  were prescribed for symptom relief. Follow-up with her primary care provider was recommended if symptoms do not improve.  Today's evaluation has revealed no signs of a dangerous process. Discussed diagnosis with patient and/or guardian. Patient and/or guardian aware of their diagnosis, possible red flag symptoms to watch out for and need for close follow up. Patient and/or guardian understands verbal and written discharge instructions. Patient and/or guardian comfortable with plan and disposition.  Patient and/or guardian has a clear mental status at this time, good insight into illness (after discussion and teaching) and has clear judgment  to make decisions regarding their care  Documentation was completed with the aid of voice recognition software. Transcription may contain typographical errors. Final Clinical Impressions(s) / UC Diagnoses   Final diagnoses:  Urinary frequency     Discharge Instructions      You were seen today for urinary frequency. Your urinalysis did not show any signs of infection or other abnormalities, and a urine culture has been sent to check for any hidden causes. You will receive further instructions based on those results. In the meantime, try to avoid caffeine and reduce your overall fluid intake, especially in the evening, to help improve your symptoms. Avoid foods and drinks that can irritate the bladder such as coffee, tea, soda, alcohol, artificial sweeteners, citrus fruits, tomato-based products, and chocolate. Take any medications prescribed to help manage your symptoms as directed. Follow up with your primary care provider if your symptoms do not improve or if they begin to get worse.  ??????????????????????????????????????????????????????????????? ????????????????????????????????????????????????????????????????????????????? ????????????????????????????????????????????????????????????????????????? ???????????????????????????????????????????????????????????? ????????? ????????????????????????????????????? ???????????????????????????????????????????????? ???????????????????? ??????????????????????????????????????? ??????????????????????????????????????????????? ??????????? ?? ???? ???? ???????????????? ?????????????? ?????????????????????????????? ?????????? ????????????????????????????????????????????????????? ??????????????????????????? ?????????????????????????????????????????? ???????????????????????????? ????????????????     ED Prescriptions     Medication Sig Dispense Auth. Provider   ibuprofen  (ADVIL ) 600 MG tablet Take 1 tablet (600 mg total) by mouth every 6 (six) hours as needed (back  pain). 12 tablet Maryruth Sol, FNP   ondansetron  (ZOFRAN -ODT) 8 MG disintegrating tablet Take 1 tablet (8 mg total) by mouth every 8 (eight) hours as needed for nausea or vomiting. 9 tablet Maryruth Sol, FNP      PDMP not reviewed this encounter.   Maryruth Sol, [place] 03/30/24 1743

## 2024-04-20 ENCOUNTER — Telehealth (INDEPENDENT_AMBULATORY_CARE_PROVIDER_SITE_OTHER): Payer: Self-pay | Admitting: Primary Care

## 2024-04-20 ENCOUNTER — Telehealth (INDEPENDENT_AMBULATORY_CARE_PROVIDER_SITE_OTHER): Payer: Self-pay

## 2024-04-20 NOTE — Telephone Encounter (Signed)
 Copied from CRM [id number]. Topic: Clinical - Medication Question >> Apr 19, 2024  1:09 PM Alysia Jumbo S wrote: Reason for CRM: Patient states that she is going out of the country next month and would like to know if provider would call in an anti-diarrhea medication to her pharmacy for her to take on her trip. Preferred pharmacy listed below.    [facility] [address] [place], [place] - [address] [place] [address] Phone: [phone] Fax: [phone] Hours: Not open 24 hours

## 2024-04-20 NOTE — Telephone Encounter (Signed)
 Noted

## 2024-04-20 NOTE — Telephone Encounter (Signed)
 Pt hasn't been seen in over a year. Pt will need a appointment

## 2024-04-20 NOTE — Telephone Encounter (Signed)
 Clled pt to add to our schedule. Pt did not answer and could not LVM.

## 2024-05-03 ENCOUNTER — Ambulatory Visit (INDEPENDENT_AMBULATORY_CARE_PROVIDER_SITE_OTHER): Payer: Self-pay | Admitting: Primary Care

## 2024-05-13 NOTE — Telephone Encounter (Signed)
 Pt returned your call to schedule appt. Please call pt back and schedule appt.

## 2024-05-16 ENCOUNTER — Ambulatory Visit: Payer: Self-pay

## 2024-05-16 NOTE — Telephone Encounter (Signed)
 FYI

## 2024-05-16 NOTE — Telephone Encounter (Signed)
 FYI Only or Action Required?: FYI only for provider  Patient was last seen in primary care on 06/26/2022 by Marius Siemens, NP. Called Nurse Triage reporting Dizziness. Symptoms began a week ago. Interventions attempted: Nothing. Symptoms are: gradually worsening.  Triage Disposition: Go to ED or PCP/Alternative with Approval Referred to [facility]. See full notes.   Patient/caregiver understands and will follow disposition?: Yes     Copied from CRM [id number]. Topic: Clinical - Red Word Triage >> May 16, 2024  9:46 AM Rennis Case wrote: Red Word that prompted transfer to Nurse Triage: pt having dizziness and feeling really tired   Interpreter Adriana Hopping ID: [id number] Reason for Disposition  Loss of vision or double vision  (Exception: Similar to previous migraines.)  Answer Assessment - Initial Assessment Questions 1. DESCRIPTION: Describe your dizziness.     -----    2. LIGHTHEADED: Do you feel lightheaded? (e.g., somewhat faint, woozy, weak upon standing)     ---------- Intermittent    3. VERTIGO: Do you feel like either you or the room is spinning or tilting? (i.e. vertigo)     ---- She is spinning     4. SEVERITY: How bad is it?  Do you feel like you are going to faint? Can you stand and walk?   - MILD: Feels slightly dizzy, but walking normally.   - MODERATE: Feels unsteady when walking, but not falling; interferes with normal activities (e.g., school, work).   - SEVERE: Unable to walk without falling, or requires assistance to walk without falling; feels like passing out now.       ----------------Mild    5. ONSET:  When did the dizziness begin?     ------- last week    6. AGGRAVATING FACTORS: Does anything make it worse? (e.g., standing, change in head position)     ------Denies aggravating factors    7. HEART RATE: Can you tell me your heart rate? How many beats in 15 seconds?  (Note: not all patients can do this)       -------------- Unsure     8. CAUSE: What do you think is causing the dizziness?     --------Unsure   9. RECURRENT SYMPTOM: Have you had dizziness before? If Yes, ask: When was the last time? What happened that time?     ------------- Yea, but unsure what caused it last time/    10. OTHER SYMPTOMS: Do you have any other symptoms? (e.g., fever, chest pain, vomiting, diarrhea, bleeding)     ---- Intermittent nausea and vomiting.  --------- Low energy  ------------Blurred vision during the call. ------------ Face feels weird, tingling   11. PREGNANCY: Is there any chance you are pregnant? When was your last menstrual period?       ----Denies.  LMP 05/23.Aaron Aas + birth control   Additional Info:   Patient relayed she can't go to the ED, due to not having coverage. Spoke to [employer] representative, and they don't have appt availability until later this month.  [facility].  Address given to patient appropriately.  Protocols used: Dizziness - Lightheadedness-A-AH

## 2024-05-17 ENCOUNTER — Ambulatory Visit: Payer: Self-pay | Admitting: Physician Assistant

## 2024-05-17 ENCOUNTER — Encounter: Payer: Self-pay | Admitting: Physician Assistant

## 2024-05-17 VITALS — BP 109/87 | HR 83 | Ht 64.0 in | Wt 118.0 lb

## 2024-05-17 DIAGNOSIS — R42 Dizziness and giddiness: Secondary | ICD-10-CM

## 2024-05-17 DIAGNOSIS — R5383 Other fatigue: Secondary | ICD-10-CM

## 2024-05-17 DIAGNOSIS — E559 Vitamin D deficiency, unspecified: Secondary | ICD-10-CM

## 2024-05-17 DIAGNOSIS — R350 Frequency of micturition: Secondary | ICD-10-CM

## 2024-05-17 LAB — POCT URINALYSIS DIP (CLINITEK)
Bilirubin, UA: NEGATIVE
Glucose, UA: NEGATIVE mg/dL
Ketones, POC UA: NEGATIVE mg/dL
Leukocytes, UA: NEGATIVE
Nitrite, UA: NEGATIVE
POC PROTEIN,UA: NEGATIVE
Spec Grav, UA: 1.025 (ref 1.010–1.025)
Urobilinogen, UA: 0.2 U/dL
pH, UA: 6 (ref 5.0–8.0)

## 2024-05-17 NOTE — Patient Instructions (Signed)
 Please drink 64 to 80 ounces water daily

## 2024-05-17 NOTE — Progress Notes (Signed)
 Patient ID: Brianna Haley, female   DOB: [birth year], 33 y.o.   MRN: [id number]   Brianna Haley, is a 33 y.o. female  GEX:[id number]  [id number]:[id number]  DOB - [birth year]  Chief Complaint  Patient presents with   Fatigue    Patient started feeling dizziness and fatigue last week. States she is eating well and drinking plenty of fluids        Subjective:   Brianna Haley is a 33 y.o. female here  Today is having trouble with fatigue and dizziness intermittently for a bout 1 week.  Started her period today.  She would like to get some lab work.  She drinks only 1 to 2 bottles of water daily.  History of vitamin D  deficiency needing replacement.  She is also having some urinary frequency without dysuria.  No pelvic pain/abdominal pain or vaginal discharge.  Periods not heavy and last <4 days and are regular   No problems updated.  ALLERGIES: No Known Allergies  PAST MEDICAL HISTORY: Past Medical History:  Diagnosis Date   Abdominal pain    Body aches    Chest discomfort    Constipation    Cough    Fatigue    Feeling of sadness    Fever    GERD (gastroesophageal reflux disease)    Helicobacter pylori antibody positive    History of spontaneous abortion    Language barrier, cultural differences    Malaise    Maternal varicella, non-immune    RBBB    Sinusitis chronic, frontal    SOB (shortness of breath)    Vitamin D  insufficiency 01/12/2020    MEDICATIONS AT HOME: Prior to Admission medications   Medication Sig Start Date End Date Taking? Authorizing Provider  Cholecalciferol (VITAMIN D3) 50 MCG (2000 UT) capsule Take 1 capsule (2,000 Units total) by mouth daily. 01/03/22  Yes Marius Siemens, NP  docusate sodium  (COLACE) 100 MG capsule Take 1 capsule (100 mg total) by mouth daily. 12/25/23   Starlene Eaton, FNP  ergocalciferol  (VITAMIN D2) 1.25 MG (50000 UT) capsule Take 1 capsule (50,000 Units total) by mouth once a week. 01/03/22   Marius Siemens, NP  hydrocortisone   cream 1 % Apply to affected area 2 times daily 12/25/23   Stanhope, Catharine M, FNP  ibuprofen  (ADVIL ) 600 MG tablet Take 1 tablet (600 mg total) by mouth every 6 (six) hours as needed (back pain). 03/30/24   Maryruth Sol, FNP  omeprazole  (PRILOSEC) 20 MG capsule Take 1 capsule (20 mg total) by mouth daily. 06/30/22   Marius Siemens, NP  ondansetron  (ZOFRAN -ODT) 8 MG disintegrating tablet Take 1 tablet (8 mg total) by mouth every 8 (eight) hours as needed for nausea or vomiting. 03/30/24   Maryruth Sol, FNP    ROS: Neg HEENT Neg resp Neg cardiac Neg GI Neg GU Neg MS Neg psych Neg neuro  Objective:   Vitals:   05/17/24 1003  BP: 109/87  Pulse: 83  SpO2: 99%  Weight: 118 lb (53.5 kg)  Height: 5' 4 (1.626 m)   Exam General appearance : Awake, alert, not in any distress. Speech Clear. Not toxic looking HEENT: Atraumatic and Normocephalic Neck: Supple, no JVD. No cervical lymphadenopathy.  Chest: Good air entry bilaterally, CTAB.  No rales/rhonchi/wheezing CVS: S1 S2 regular, no murmurs.  Extremities: B/L Lower Ext shows no edema, both legs are warm to touch Neurology: Awake alert, and oriented X 3, CN II-XII intact, Non focal Skin: No Rash  Data Review No  results found for: HGBA1C  Assessment & Plan   1. Dizziness (Primary) Please drink 64 to 80 ounces water daily - POCT URINALYSIS DIP (CLINITEK) - CBC with Differential - TSH - Basic Metabolic Panel  2. Fatigue, unspecified type - POCT URINALYSIS DIP (CLINITEK) - CBC with Differential - TSH - Basic Metabolic Panel  3. Vitamin D  deficiency - Vitamin D , 25-hydroxy  4. Urinary frequency - POCT URINALYSIS DIP (CLINITEK)    Return if symptoms worsen or fail to improve, for PCP for chronic conditions.  The patient was given clear instructions to go to ER or return to medical center if symptoms don't improve, worsen or new problems develop. The patient verbalized understanding. The patient was told  to call to get lab results if they haven't heard anything in the next week.      Dulce Gibbs, PA-C [facility] and [facility] [place], [place] [phone]   05/17/2024, 10:42 AM

## 2024-05-18 LAB — CBC WITH DIFFERENTIAL/PLATELET
Basophils Absolute: 0.1 10*3/uL (ref 0.0–0.2)
Basos: 1 %
EOS (ABSOLUTE): 0.2 10*3/uL (ref 0.0–0.4)
Eos: 4 %
Hematocrit: 41.7 % (ref 34.0–46.6)
Hemoglobin: 13 g/dL (ref 11.1–15.9)
Immature Grans (Abs): 0 10*3/uL (ref 0.0–0.1)
Immature Granulocytes: 0 %
Lymphocytes Absolute: 1.9 10*3/uL (ref 0.7–3.1)
Lymphs: 37 %
MCH: 30.2 pg (ref 26.6–33.0)
MCHC: 31.2 g/dL — ABNORMAL LOW (ref 31.5–35.7)
MCV: 97 fL (ref 79–97)
Monocytes Absolute: 0.2 10*3/uL (ref 0.1–0.9)
Monocytes: 4 %
Neutrophils Absolute: 2.8 10*3/uL (ref 1.4–7.0)
Neutrophils: 54 %
Platelets: 295 10*3/uL (ref 150–450)
RBC: 4.31 x10E6/uL (ref 3.77–5.28)
RDW: 11.6 % — ABNORMAL LOW (ref 11.7–15.4)
WBC: 5.2 10*3/uL (ref 3.4–10.8)

## 2024-05-18 LAB — BASIC METABOLIC PANEL WITH GFR
BUN/Creatinine Ratio: 25 — ABNORMAL HIGH (ref 9–23)
BUN: 15 mg/dL (ref 6–20)
CO2: 21 mmol/L (ref 20–29)
Calcium: 9.5 mg/dL (ref 8.7–10.2)
Chloride: 100 mmol/L (ref 96–106)
Creatinine, Ser: 0.6 mg/dL (ref 0.57–1.00)
Glucose: 68 mg/dL — ABNORMAL LOW (ref 70–99)
Potassium: 4.2 mmol/L (ref 3.5–5.2)
Sodium: 138 mmol/L (ref 134–144)
eGFR: 122 mL/min/{1.73_m2} (ref >59)

## 2024-05-18 LAB — TSH: TSH: 2.38 u[IU]/mL (ref 0.450–4.500)

## 2024-05-18 LAB — VITAMIN D 25 HYDROXY (VIT D DEFICIENCY, FRACTURES): Vit D, 25-Hydroxy: 29.3 ng/mL — ABNORMAL LOW (ref 30.0–100.0)

## 2024-05-19 ENCOUNTER — Telehealth: Payer: Self-pay

## 2024-05-19 NOTE — Telephone Encounter (Signed)
 Copied from CRM [id number]. Topic: General - Other >> May 19, 2024  3:41 PM Emylou G wrote: Reason for CRM: Pls call patient back.Aaron Aas looking for lab results.

## 2024-05-20 NOTE — Telephone Encounter (Signed)
 Will forward to provider

## 2024-05-24 ENCOUNTER — Ambulatory Visit: Payer: Self-pay

## 2024-05-24 NOTE — Telephone Encounter (Signed)
 Copied from CRM [id number]. Topic: Clinical - Lab/Test Results >> May 24, 2024  3:55 PM Rosaria BRAVO wrote: Reason for CRM: Pt wants to discuss lab results, please advise   Best contact: [phone]

## 2024-05-30 ENCOUNTER — Encounter (HOSPITAL_COMMUNITY): Payer: Self-pay

## 2024-05-30 ENCOUNTER — Ambulatory Visit (HOSPITAL_COMMUNITY)
Admission: EM | Admit: 2024-05-30 | Discharge: 2024-05-30 | Disposition: A | Discharge status: Home / Self Care | Payer: Self-pay

## 2024-05-30 DIAGNOSIS — K219 Gastro-esophageal reflux disease without esophagitis: Secondary | ICD-10-CM

## 2024-05-30 DIAGNOSIS — K2100 Gastro-esophageal reflux disease with esophagitis, without bleeding: Secondary | ICD-10-CM

## 2024-05-30 DIAGNOSIS — R1013 Epigastric pain: Secondary | ICD-10-CM

## 2024-05-30 DIAGNOSIS — S301XXA Contusion of abdominal wall, initial encounter: Secondary | ICD-10-CM

## 2024-05-30 LAB — POCT URINALYSIS DIP (MANUAL ENTRY)
Bilirubin, UA: NEGATIVE
Glucose, UA: NEGATIVE mg/dL
Ketones, POC UA: NEGATIVE mg/dL
Leukocytes, UA: NEGATIVE
Nitrite, UA: NEGATIVE
Protein Ur, POC: NEGATIVE mg/dL
Spec Grav, UA: 1.02 (ref 1.010–1.025)
Urobilinogen, UA: 0.2 U/dL
pH, UA: 6 (ref 5.0–8.0)

## 2024-05-30 LAB — POCT URINE PREGNANCY: Preg Test, Ur: NEGATIVE

## 2024-05-30 MED ORDER — IBUPROFEN 600 MG PO TABS: 600.0000 mg | ORAL_TABLET | Freq: Four times a day (QID) | ORAL | 0 refills | Status: AC | PRN

## 2024-05-30 MED ORDER — OMEPRAZOLE 20 MG PO CPDR: 20.0000 mg | DELAYED_RELEASE_CAPSULE | Freq: Every day | ORAL | 3 refills | Status: AC

## 2024-05-30 NOTE — ED Triage Notes (Signed)
 Interpreter: Elveria [id number]  Chief Complaint: abdominal pain with cramping spreading into the chest and fatigue. Denies NVD or other sick symptoms.   Sick exposure: No  Onset: 2 days ago  Prescriptions or OTC medications tried: Yes- tylenol     with mild relief  New foods, medications, or products: No  Recent Travel: No

## 2024-05-30 NOTE — Discharge Instructions (Addendum)
  1. Contusion of abdominal wall, initial encounter (Primary) - POCT urine pregnancy performed in UC is negative - POC urinalysis dipstick completed in UC shows no leukocytes, no nitrite, small blood, these findings are not indicative of urinary tract infection. - ibuprofen  (ADVIL ) 600 MG tablet; Take 1 tablet (600 mg total) by mouth every 6 (six) hours as needed.  Dispense: 30 tablet; Refill: 0  2. Gastroesophageal reflux disease without esophagitis - omeprazole  (PRILOSEC) 20 MG capsule; Take 1 capsule (20 mg total) by mouth daily.  Dispense: 30 capsule; Refill: 3 -Continue to monitor symptoms for any change in severity if there is any escalation of current symptoms or development of new symptoms follow-up in ER for further evaluation and management.

## 2024-05-30 NOTE — ED Provider Notes (Signed)
 [facility]  Note:  This document was prepared using Dragon voice recognition software and may include unintentional dictation errors.  MRN: [id number] DOB: 1991-08-02  Subjective:   Brianna Haley is a 33 y.o. female presenting for lower abdominal pain on palpation after her son jumped on her while playing landing on her stomach.  Patient reports that cramping occasionally radiates towards the chest and she is slightly more fatigued than normal.  Denies any known sick contacts.  Patient has tried taking Tylenol  with mild relief.  Patient denies any other gastrointestinal symptoms such as diarrhea, constipation, nausea/vomiting, flank pain.  Patient has history of gastroesophageal reflux and used to take omeprazole  but has been out of medication for a while and states that occasionally she does get heartburn and would like a refill of her medication.  No current facility-administered medications for this encounter.  Current Outpatient Medications:    ibuprofen  (ADVIL ) 600 MG tablet, Take 1 tablet (600 mg total) by mouth every 6 (six) hours as needed., Disp: 30 tablet, Rfl: 0   Cholecalciferol (VITAMIN D3) 50 MCG (2000 UT) capsule, Take 1 capsule (2,000 Units total) by mouth daily., Disp: 90 capsule, Rfl: 1   docusate sodium  (COLACE) 100 MG capsule, Take 1 capsule (100 mg total) by mouth daily., Disp: 30 capsule, Rfl: 0   ergocalciferol  (VITAMIN D2) 1.25 MG (50000 UT) capsule, Take 1 capsule (50,000 Units total) by mouth once a week., Disp: 12 capsule, Rfl: 0   hydrocortisone  cream 1 %, Apply to affected area 2 times daily, Disp: 15 g, Rfl: 0   omeprazole  (PRILOSEC) 20 MG capsule, Take 1 capsule (20 mg total) by mouth daily., Disp: 30 capsule, Rfl: 3   ondansetron  (ZOFRAN -ODT) 8 MG disintegrating tablet, Take 1 tablet (8 mg total) by mouth every 8 (eight) hours as needed for nausea or vomiting., Disp: 9 tablet, Rfl: 0   No Known Allergies  Past Medical History:  Diagnosis Date    Abdominal pain    Body aches    Chest discomfort    Constipation    Cough    Fatigue    Feeling of sadness    Fever    GERD (gastroesophageal reflux disease)    Helicobacter pylori antibody positive    History of spontaneous abortion    Language barrier, cultural differences    Malaise    Maternal varicella, non-immune    RBBB    Sinusitis chronic, frontal    SOB (shortness of breath)    Vitamin D  insufficiency 01/12/2020     History reviewed. No pertinent surgical history.  Family History  Problem Relation Age of Onset   Healthy Mother    Healthy Father     Social History   Tobacco Use   Smoking status: Never   Smokeless tobacco: Never  Vaping Use   Vaping status: Never Used  Substance Use Topics   Alcohol use: No   Drug use: No    ROS Refer to HPI for ROS details.  Objective:   Vitals: BP (!) 97/56 (BP Location: Left Arm)   Pulse 76   Temp 98 F (36.7 C) (Oral)   Resp 18   Ht 5' 4 (1.626 m)   Wt 118 lb (53.5 kg)   LMP 05/17/2024 (Approximate)   SpO2 98%   BMI 20.25 kg/m   Physical Exam Vitals and nursing note reviewed.  Constitutional:      General: She is not in acute distress.    Appearance: Normal appearance. She is  well-developed. She is not ill-appearing, toxic-appearing or diaphoretic.  HENT:     Head: Normocephalic and atraumatic.   Cardiovascular:     Rate and Rhythm: Normal rate.  Pulmonary:     Effort: Pulmonary effort is normal. No respiratory distress.  Abdominal:     General: There is no distension.     Palpations: Abdomen is soft.     Tenderness: There is abdominal tenderness. There is no right CVA tenderness, left CVA tenderness, guarding or rebound.   Skin:    General: Skin is warm and dry.   Neurological:     General: No focal deficit present.     Mental Status: She is alert and oriented to person, place, and time.   Psychiatric:        Mood and Affect: Mood normal.        Behavior: Behavior normal.      Procedures  Results for orders placed or performed during the hospital encounter of 05/30/24 (from the past 24 hours)  POC urinalysis dipstick     Status: Abnormal   Collection Time: 05/30/24  5:04 PM  Result Value Ref Range   Color, UA yellow yellow   Clarity, UA cloudy (A) clear   Glucose, UA negative negative mg/dL   Bilirubin, UA negative negative   Ketones, POC UA negative negative mg/dL   Spec Grav, UA 8.979 8.989 - 1.025   Blood, UA small (A) negative   pH, UA 6.0 5.0 - 8.0   Protein Ur, POC negative negative mg/dL   Urobilinogen, UA 0.2 0.2 or 1.0 E.U./dL   Nitrite, UA Negative Negative   Leukocytes, UA Negative Negative  POCT urine pregnancy     Status: Normal   Collection Time: 05/30/24  5:06 PM  Result Value Ref Range   Preg Test, Ur Negative Negative    No results found.   Assessment and Plan :     Discharge Instructions       1. Contusion of abdominal wall, initial encounter (Primary) - POCT urine pregnancy performed in UC is negative - POC urinalysis dipstick completed in UC shows no leukocytes, no nitrite, small blood, these findings are not indicative of urinary tract infection. - ibuprofen  (ADVIL ) 600 MG tablet; Take 1 tablet (600 mg total) by mouth every 6 (six) hours as needed.  Dispense: 30 tablet; Refill: 0  2. Gastroesophageal reflux disease without esophagitis - omeprazole  (PRILOSEC) 20 MG capsule; Take 1 capsule (20 mg total) by mouth daily.  Dispense: 30 capsule; Refill: 3 -Continue to monitor symptoms for any change in severity if there is any escalation of current symptoms or development of new symptoms follow-up in ER for further evaluation and management.      Sebastain Fishbaugh B Orel Hord   Jakaya Jacobowitz B, NP 05/30/24 1800

## 2024-07-26 ENCOUNTER — Other Ambulatory Visit (HOSPITAL_COMMUNITY)
Admission: RE | Admit: 2024-07-26 | Discharge: 2024-07-26 | Disposition: A | Discharge status: Home / Self Care | Payer: Self-pay | Source: Ambulatory Visit | Attending: Nurse Practitioner | Admitting: Nurse Practitioner

## 2024-07-26 ENCOUNTER — Encounter: Payer: Self-pay | Admitting: Nurse Practitioner

## 2024-07-26 ENCOUNTER — Ambulatory Visit (INDEPENDENT_AMBULATORY_CARE_PROVIDER_SITE_OTHER): Payer: Self-pay | Admitting: Nurse Practitioner

## 2024-07-26 VITALS — BP 108/72 | HR 74 | Ht 64.0 in | Wt 118.0 lb

## 2024-07-26 DIAGNOSIS — Z113 Encounter for screening for infections with a predominantly sexual mode of transmission: Secondary | ICD-10-CM

## 2024-07-26 DIAGNOSIS — Z01419 Encounter for gynecological examination (general) (routine) without abnormal findings: Secondary | ICD-10-CM

## 2024-07-26 DIAGNOSIS — Z603 Acculturation difficulty: Secondary | ICD-10-CM

## 2024-07-26 DIAGNOSIS — Z124 Encounter for screening for malignant neoplasm of cervix: Secondary | ICD-10-CM

## 2024-07-26 DIAGNOSIS — N898 Other specified noninflammatory disorders of vagina: Secondary | ICD-10-CM | POA: Insufficient documentation

## 2024-07-26 DIAGNOSIS — Z758 Other problems related to medical facilities and other health care: Secondary | ICD-10-CM

## 2024-07-26 DIAGNOSIS — R300 Dysuria: Secondary | ICD-10-CM

## 2024-07-26 LAB — POCT URINALYSIS DIPSTICK
Bilirubin, UA: NEGATIVE
Glucose, UA: NEGATIVE
Ketones, UA: NEGATIVE
Nitrite, UA: NEGATIVE
Protein, UA: NEGATIVE
Spec Grav, UA: <=1.005 — AB (ref 1.010–1.025)
Urobilinogen, UA: 0.2 U/dL
pH, UA: 6 (ref 5.0–8.0)

## 2024-07-26 NOTE — Progress Notes (Signed)
 Has steady partner her husband. Mirena  in place since 2022. Today has white odorous discharge. Wants STI testing today both blood work and swab. Also has burning with urination.

## 2024-07-26 NOTE — Progress Notes (Addendum)
 Subjective:     Brianna Haley is a 33 y.o. female here at [facility] for a routine exam.  Current complaints: vaginal discharge with odor for about 1 week and pain with urination for about a week. Also complains of vaginal itching and burning and burning with urination. Recently returned from [place] a week ago. Personal and family health history reviewed: yes.  Do you have a primary care provider? Rosaline Bohr, NP Do you feel safe at home? Yes   Flowsheet Row Office Visit from 05/17/2024 in [facility]  PHQ-2 Total Score 0    Health Maintenance Due  Topic Date Due   Hepatitis B Vaccines 19-59 Average Risk (1 of 3 - 19+ 3-dose series) Never done   HPV VACCINES (1 - 3-dose SCDM series) Never done   Cervical Cancer Screening (HPV/Pap Cotest)  06/07/2023   COVID-19 Vaccine (1 - 2024-25 season) Never done   INFLUENZA VACCINE  07/01/2024     Risk factors for chronic health problems: None Smoking: None Alchohol/how much: None Pt BMI: Body mass index is 20.25 kg/m.   Gynecologic History Patient's last menstrual period was 07/16/2024 (approximate). Contraception: IUD Mirena  placed 09/12/2021 Last Pap: 06/06/2020. Results were: normal Last mammogram: N/A. Results were: N/A  Obstetric History OB History  Gravida Para Term Preterm AB Living  3 2 2  0 1 2  SAB IAB Ectopic Multiple Live Births  1 0 0 0 2    # Outcome Date GA Lbr Len/2nd Weight Sex Type Anes PTL Lv  3 Term 07/04/21 [redacted]w[redacted]d   M Vag-Spont  N LIV  2 Term 05/04/17    M Vag-Spont EPI  LIV  1 SAB              The following portions of the patient's history were reviewed and updated as appropriate: allergies, current medications, past family history, past medical history, past social history, past surgical history, and problem list.  Review of Systems Pertinent items noted in HPI and remainder of comprehensive ROS otherwise negative.    Objective:   Today's Vitals   07/26/24 1525  BP: 108/72  Pulse: 74   Weight: 53.5 kg  Height: 5' 4 (1.626 m)   Body mass index is 20.25 kg/m.  VS reviewed, nursing note reviewed,  Constitutional: well developed, well nourished, no distress HEENT: normocephalic, thyroid without enlargement or mass HEART: RRR, no murmurs rubs/gallops RESP: clear and equal to auscultation bilaterally in all lobes  Breast Exam:  exam performed: right breast normal without mass, skin or nipple changes or axillary nodes, left breast normal without mass, skin or nipple changes or axillary nodes Abdomen: soft Neuro: alert and oriented x 3 Skin: warm, dry Psych: affect normal Pelvic exam: Performed: erythremic Cervix consistent with cervicitis, visually closed, without lesion, mucous like discharge with greenish tint,  erythremic vaginal walls and erythremic introitus. Introitus sensitive to touch. Mirena  IUD strings visualized on exam. Bimanual exam: Deferred  Flowsheet Row Office Visit from 05/17/2024 in [facility] 1  PHQ-9 Total Score 0      05/17/2024   10:12 AM 06/26/2022    1:43 PM 12/30/2021   10:06 AM 10/17/2021   10:36 AM  GAD 7 : Generalized Anxiety Score  Nervous, Anxious, on Edge 0 0 0 0  Control/stop worrying 0 0 0 0  Worry too much - different things 0 0 0 0  Trouble relaxing 0 0 0 0  Restless 0 0 0 0  Easily annoyed or irritable  0 0 0 0  Afraid - awful might happen 0 0 0 0  Total GAD 7 Score 0 0 0 0  Anxiety Difficulty  Not difficult at all Not difficult at all Not difficult at all        Assessment/Plan:  1. Well woman exam (Primary) - Cytology - PAP( [place])  2. Cervical cancer screening - Cytology - PAP( [place])  3. Language barrier Khmer interpreter used during exam  4. Vaginal discharge - Cervicovaginal ancillary only( [place])  5. Vaginal odor - Cervicovaginal ancillary only( [place])  6. Dysuria - Urine Culture - POCT Urinalysis Dipstick  7. Screening examination for STD (sexually transmitted disease) -  Cervicovaginal ancillary only( [place]) - Hepatitis B surface antigen - Hepatitis C Antibody - RPR - HIV antibody (with reflex)     Return in about 1 year (around 07/26/2025) for Annual.   Derrek JINNY Freund, NP Student 3:42 PM

## 2024-07-27 ENCOUNTER — Ambulatory Visit: Payer: Self-pay | Admitting: Nurse Practitioner

## 2024-07-27 DIAGNOSIS — B9689 Other specified bacterial agents as the cause of diseases classified elsewhere: Secondary | ICD-10-CM

## 2024-07-27 DIAGNOSIS — A749 Chlamydial infection, unspecified: Secondary | ICD-10-CM

## 2024-07-27 LAB — RPR: RPR Ser Ql: NONREACTIVE

## 2024-07-27 LAB — CYTOLOGY - PAP
Comment: NEGATIVE
Diagnosis: NEGATIVE
High risk HPV: NEGATIVE

## 2024-07-27 LAB — CERVICOVAGINAL ANCILLARY ONLY
Bacterial Vaginitis (gardnerella): POSITIVE — AB
Candida Glabrata: NEGATIVE
Candida Vaginitis: NEGATIVE
Chlamydia: POSITIVE — AB
Comment: NEGATIVE
Comment: NEGATIVE
Comment: NEGATIVE
Comment: NEGATIVE
Comment: NEGATIVE
Comment: NORMAL
Neisseria Gonorrhea: NEGATIVE
Trichomonas: NEGATIVE

## 2024-07-27 LAB — HIV ANTIBODY (ROUTINE TESTING W REFLEX): HIV Screen 4th Generation wRfx: NONREACTIVE

## 2024-07-27 LAB — HEPATITIS C ANTIBODY: Hep C Virus Ab: NONREACTIVE

## 2024-07-27 LAB — HEPATITIS B SURFACE ANTIGEN: Hepatitis B Surface Ag: NEGATIVE

## 2024-07-27 MED ORDER — METRONIDAZOLE 500 MG PO TABS: 500.0000 mg | ORAL_TABLET | Freq: Two times a day (BID) | ORAL | 0 refills | Status: DC

## 2024-07-27 MED ORDER — DOXYCYCLINE HYCLATE 100 MG PO CAPS: 100.0000 mg | ORAL_CAPSULE | Freq: Two times a day (BID) | ORAL | 1 refills | Status: AC

## 2024-07-28 LAB — URINE CULTURE: Organism ID, Bacteria: NO GROWTH

## 2024-07-29 ENCOUNTER — Telehealth: Payer: Self-pay

## 2024-07-29 NOTE — Telephone Encounter (Signed)
 TC to pt to discuss recent test results. Pt unsure of how she got Chlamydia as she has only been active with her husband. In depth discussion about this only being sexually transmitted, and her and husband needing to both receive treatment.

## 2024-09-22 ENCOUNTER — Ambulatory Visit: Payer: Self-pay

## 2024-09-22 ENCOUNTER — Other Ambulatory Visit (HOSPITAL_COMMUNITY)
Admission: RE | Admit: 2024-09-22 | Discharge: 2024-09-22 | Disposition: A | Payer: Self-pay | Source: Ambulatory Visit | Attending: Obstetrics and Gynecology | Admitting: Obstetrics and Gynecology

## 2024-09-22 VITALS — BP 117/72 | HR 83

## 2024-09-22 DIAGNOSIS — Z8619 Personal history of other infectious and parasitic diseases: Secondary | ICD-10-CM

## 2024-09-22 DIAGNOSIS — Z113 Encounter for screening for infections with a predominantly sexual mode of transmission: Secondary | ICD-10-CM | POA: Insufficient documentation

## 2024-09-22 NOTE — Progress Notes (Signed)
..  SUBJECTIVE:  33 y.o. female is in the office for test of cure related to positive CT. Denies abnormal vaginal bleeding or significant pelvic pain or fever. No UTI symptoms. Denies history of known exposure to STD.  No LMP recorded.  OBJECTIVE:  She appears well, afebrile. Urine dipstick: not done.  ASSESSMENT:  STD Screening Hx of CT   PLAN:  GC, chlamydia, trichomonas, BVAG, CVAG probe sent to lab. Treatment: To be determined once lab results are received ROV prn if symptoms persist or worsen.

## 2024-09-23 LAB — CERVICOVAGINAL ANCILLARY ONLY
Bacterial Vaginitis (gardnerella): POSITIVE — AB
Candida Glabrata: NEGATIVE
Candida Vaginitis: NEGATIVE
Chlamydia: NEGATIVE
Comment: NEGATIVE
Comment: NEGATIVE
Comment: NEGATIVE
Comment: NEGATIVE
Comment: NEGATIVE
Comment: NORMAL
Neisseria Gonorrhea: NEGATIVE
Trichomonas: NEGATIVE

## 2024-09-25 ENCOUNTER — Ambulatory Visit: Payer: Self-pay

## 2024-09-25 DIAGNOSIS — B9689 Other specified bacterial agents as the cause of diseases classified elsewhere: Secondary | ICD-10-CM

## 2024-09-26 MED ORDER — METRONIDAZOLE 0.75 % VA GEL: 1.0000 | Freq: Every day | VAGINAL | 0 refills | Status: AC
